# Patient Record
Sex: Male | Born: 1950 | Race: White | Hispanic: Refuse to answer | Marital: Married | Smoking: Never smoker
Health system: Southern US, Community
[De-identification: ages and names within clinical notes are randomized; demographics above are authoritative.]

## PROBLEM LIST (undated history)

## (undated) DIAGNOSIS — M109 Gout, unspecified: Secondary | ICD-10-CM

## (undated) DIAGNOSIS — Z87442 Personal history of urinary calculi: Secondary | ICD-10-CM

## (undated) HISTORY — PX: LIPOMA EXCISION: SHX5283

## (undated) HISTORY — PX: KNEE SURGERY: SHX244

## (undated) HISTORY — PX: VASECTOMY: SHX75

## (undated) HISTORY — PX: RETINAL TEAR REPAIR CRYOTHERAPY: SHX5304

## (undated) HISTORY — DX: Gout, unspecified: M10.9

---

## 2012-02-27 DIAGNOSIS — H43811 Vitreous degeneration, right eye: Secondary | ICD-10-CM | POA: Insufficient documentation

## 2012-02-27 HISTORY — DX: Vitreous degeneration, right eye: H43.811

## 2012-03-28 DIAGNOSIS — H269 Unspecified cataract: Secondary | ICD-10-CM

## 2012-03-28 HISTORY — DX: Unspecified cataract: H26.9

## 2013-04-15 DIAGNOSIS — R7301 Impaired fasting glucose: Secondary | ICD-10-CM

## 2013-04-15 DIAGNOSIS — L509 Urticaria, unspecified: Secondary | ICD-10-CM | POA: Insufficient documentation

## 2013-04-15 DIAGNOSIS — M109 Gout, unspecified: Secondary | ICD-10-CM

## 2013-04-15 DIAGNOSIS — K469 Unspecified abdominal hernia without obstruction or gangrene: Secondary | ICD-10-CM | POA: Insufficient documentation

## 2013-04-15 DIAGNOSIS — R944 Abnormal results of kidney function studies: Secondary | ICD-10-CM

## 2013-04-15 DIAGNOSIS — L259 Unspecified contact dermatitis, unspecified cause: Secondary | ICD-10-CM

## 2013-04-15 DIAGNOSIS — M2550 Pain in unspecified joint: Secondary | ICD-10-CM

## 2013-04-15 DIAGNOSIS — M6281 Muscle weakness (generalized): Secondary | ICD-10-CM

## 2013-04-15 DIAGNOSIS — D179 Benign lipomatous neoplasm, unspecified: Secondary | ICD-10-CM

## 2013-04-15 DIAGNOSIS — I10 Essential (primary) hypertension: Secondary | ICD-10-CM | POA: Insufficient documentation

## 2013-04-15 DIAGNOSIS — F411 Generalized anxiety disorder: Secondary | ICD-10-CM

## 2013-04-15 DIAGNOSIS — F32A Depression, unspecified: Secondary | ICD-10-CM

## 2013-04-15 DIAGNOSIS — F410 Panic disorder [episodic paroxysmal anxiety] without agoraphobia: Secondary | ICD-10-CM | POA: Insufficient documentation

## 2013-04-15 DIAGNOSIS — R5381 Other malaise: Secondary | ICD-10-CM

## 2013-04-15 DIAGNOSIS — M779 Enthesopathy, unspecified: Secondary | ICD-10-CM

## 2013-04-15 DIAGNOSIS — M62838 Other muscle spasm: Secondary | ICD-10-CM | POA: Insufficient documentation

## 2013-04-15 DIAGNOSIS — E8881 Metabolic syndrome: Secondary | ICD-10-CM | POA: Insufficient documentation

## 2013-04-15 DIAGNOSIS — E7800 Pure hypercholesterolemia, unspecified: Secondary | ICD-10-CM | POA: Insufficient documentation

## 2013-04-15 DIAGNOSIS — R635 Abnormal weight gain: Secondary | ICD-10-CM

## 2013-04-15 DIAGNOSIS — R21 Rash and other nonspecific skin eruption: Secondary | ICD-10-CM

## 2013-04-15 DIAGNOSIS — D485 Neoplasm of uncertain behavior of skin: Secondary | ICD-10-CM | POA: Insufficient documentation

## 2013-04-15 DIAGNOSIS — E669 Obesity, unspecified: Secondary | ICD-10-CM

## 2013-04-15 HISTORY — DX: Abnormal weight gain: R63.5

## 2013-04-15 HISTORY — DX: Impaired fasting glucose: R73.01

## 2013-04-15 HISTORY — DX: Gout, unspecified: M10.9

## 2013-04-15 HISTORY — DX: Pure hypercholesterolemia, unspecified: E78.00

## 2013-04-15 HISTORY — DX: Other muscle spasm: M62.838

## 2013-04-15 HISTORY — DX: Metabolic syndrome: E88.810

## 2013-04-15 HISTORY — DX: Rash and other nonspecific skin eruption: R21

## 2013-04-15 HISTORY — DX: Neoplasm of uncertain behavior of skin: D48.5

## 2013-04-15 HISTORY — DX: Abnormal results of kidney function studies: R94.4

## 2013-04-15 HISTORY — DX: Benign lipomatous neoplasm, unspecified: D17.9

## 2013-04-15 HISTORY — DX: Urticaria, unspecified: L50.9

## 2013-04-15 HISTORY — DX: Obesity, unspecified: E66.9

## 2013-04-15 HISTORY — DX: Unspecified contact dermatitis, unspecified cause: L25.9

## 2013-04-15 HISTORY — DX: Muscle weakness (generalized): M62.81

## 2013-04-15 HISTORY — DX: Essential (primary) hypertension: I10

## 2013-04-15 HISTORY — DX: Depression, unspecified: F32.A

## 2013-04-15 HISTORY — DX: Enthesopathy, unspecified: M77.9

## 2013-04-15 HISTORY — DX: Other malaise: R53.81

## 2013-04-15 HISTORY — DX: Panic disorder (episodic paroxysmal anxiety): F41.0

## 2013-04-15 HISTORY — DX: Pain in unspecified joint: M25.50

## 2013-04-15 HISTORY — DX: Unspecified abdominal hernia without obstruction or gangrene: K46.9

## 2013-04-15 HISTORY — DX: Generalized anxiety disorder: F41.1

## 2016-03-28 DIAGNOSIS — H524 Presbyopia: Secondary | ICD-10-CM

## 2016-03-28 HISTORY — DX: Presbyopia: H52.4

## 2017-07-30 DIAGNOSIS — D229 Melanocytic nevi, unspecified: Secondary | ICD-10-CM

## 2017-07-30 DIAGNOSIS — H5213 Myopia, bilateral: Secondary | ICD-10-CM | POA: Insufficient documentation

## 2017-07-30 DIAGNOSIS — Z8249 Family history of ischemic heart disease and other diseases of the circulatory system: Secondary | ICD-10-CM

## 2017-07-30 DIAGNOSIS — F17200 Nicotine dependence, unspecified, uncomplicated: Secondary | ICD-10-CM | POA: Insufficient documentation

## 2017-07-30 HISTORY — DX: Nicotine dependence, unspecified, uncomplicated: F17.200

## 2017-07-30 HISTORY — DX: Melanocytic nevi, unspecified: D22.9

## 2017-07-30 HISTORY — DX: Myopia, bilateral: H52.13

## 2017-07-30 HISTORY — DX: Family history of ischemic heart disease and other diseases of the circulatory system: Z82.49

## 2018-10-03 DIAGNOSIS — N200 Calculus of kidney: Secondary | ICD-10-CM

## 2018-10-03 HISTORY — DX: Calculus of kidney: N20.0

## 2018-11-12 DIAGNOSIS — M26609 Unspecified temporomandibular joint disorder, unspecified side: Secondary | ICD-10-CM

## 2018-11-12 DIAGNOSIS — H90A22 Sensorineural hearing loss, unilateral, left ear, with restricted hearing on the contralateral side: Secondary | ICD-10-CM

## 2018-11-12 HISTORY — DX: Unspecified temporomandibular joint disorder, unspecified side: M26.609

## 2018-11-12 HISTORY — DX: Sensorineural hearing loss, unilateral, left ear, with restricted hearing on the contralateral side: H90.A22

## 2019-03-26 DIAGNOSIS — R82994 Hypercalciuria: Secondary | ICD-10-CM

## 2019-03-26 HISTORY — DX: Hypercalciuria: R82.994

## 2019-11-12 DIAGNOSIS — L570 Actinic keratosis: Secondary | ICD-10-CM

## 2019-11-12 DIAGNOSIS — L738 Other specified follicular disorders: Secondary | ICD-10-CM | POA: Insufficient documentation

## 2019-11-12 DIAGNOSIS — B070 Plantar wart: Secondary | ICD-10-CM

## 2019-11-12 DIAGNOSIS — B360 Pityriasis versicolor: Secondary | ICD-10-CM

## 2019-11-12 HISTORY — DX: Other specified follicular disorders: L73.8

## 2019-11-12 HISTORY — DX: Pityriasis versicolor: B36.0

## 2019-11-12 HISTORY — DX: Actinic keratosis: L57.0

## 2019-11-12 HISTORY — DX: Plantar wart: B07.0

## 2019-11-13 DIAGNOSIS — M180 Bilateral primary osteoarthritis of first carpometacarpal joints: Secondary | ICD-10-CM

## 2019-11-13 HISTORY — DX: Bilateral primary osteoarthritis of first carpometacarpal joints: M18.0

## 2021-09-06 ENCOUNTER — Emergency Department: Payer: Medicare PPO

## 2021-09-06 ENCOUNTER — Other Ambulatory Visit: Payer: Self-pay

## 2021-09-06 ENCOUNTER — Encounter: Payer: Self-pay | Admitting: *Deleted

## 2021-09-06 DIAGNOSIS — I10 Essential (primary) hypertension: Secondary | ICD-10-CM | POA: Diagnosis not present

## 2021-09-06 DIAGNOSIS — U071 COVID-19: Secondary | ICD-10-CM | POA: Diagnosis not present

## 2021-09-06 DIAGNOSIS — R0981 Nasal congestion: Secondary | ICD-10-CM | POA: Diagnosis present

## 2021-09-06 DIAGNOSIS — N132 Hydronephrosis with renal and ureteral calculous obstruction: Secondary | ICD-10-CM | POA: Diagnosis not present

## 2021-09-06 DIAGNOSIS — Z79899 Other long term (current) drug therapy: Secondary | ICD-10-CM | POA: Insufficient documentation

## 2021-09-06 LAB — URINALYSIS, COMPLETE (UACMP) WITH MICROSCOPIC
Bacteria, UA: NONE SEEN
Bilirubin Urine: NEGATIVE
Glucose, UA: NEGATIVE mg/dL
Ketones, ur: NEGATIVE mg/dL
Leukocytes,Ua: NEGATIVE
Nitrite: NEGATIVE
Protein, ur: 30 mg/dL — AB
RBC / HPF: >50 RBC/hpf — ABNORMAL HIGH (ref 0–5)
Specific Gravity, Urine: 1.017 (ref 1.005–1.030)
Squamous Epithelial / HPF: NONE SEEN (ref 0–5)
pH: 5 (ref 5.0–8.0)

## 2021-09-06 LAB — CBC WITH DIFFERENTIAL/PLATELET
Abs Immature Granulocytes: 0.04 10*3/uL (ref 0.00–0.07)
Basophils Absolute: 0 10*3/uL (ref 0.0–0.1)
Basophils Relative: 1 %
Eosinophils Absolute: 0 10*3/uL (ref 0.0–0.5)
Eosinophils Relative: 0 %
HCT: 43.9 % (ref 39.0–52.0)
Hemoglobin: 15 g/dL (ref 13.0–17.0)
Immature Granulocytes: 1 %
Lymphocytes Relative: 11 %
Lymphs Abs: 0.8 10*3/uL (ref 0.7–4.0)
MCH: 30.2 pg (ref 26.0–34.0)
MCHC: 34.2 g/dL (ref 30.0–36.0)
MCV: 88.5 fL (ref 80.0–100.0)
Monocytes Absolute: 1.2 10*3/uL — ABNORMAL HIGH (ref 0.1–1.0)
Monocytes Relative: 15 %
Neutro Abs: 5.7 10*3/uL (ref 1.7–7.7)
Neutrophils Relative %: 72 %
Platelets: 167 10*3/uL (ref 150–400)
RBC: 4.96 MIL/uL (ref 4.22–5.81)
RDW: 12.9 % (ref 11.5–15.5)
WBC: 7.8 10*3/uL (ref 4.0–10.5)
nRBC: 0 % (ref 0.0–0.2)

## 2021-09-06 LAB — COMPREHENSIVE METABOLIC PANEL
ALT: 27 U/L (ref 0–44)
AST: 23 U/L (ref 15–41)
Albumin: 4.4 g/dL (ref 3.5–5.0)
Alkaline Phosphatase: 74 U/L (ref 38–126)
Anion gap: 6 (ref 5–15)
BUN: 22 mg/dL (ref 8–23)
CO2: 27 mmol/L (ref 22–32)
Calcium: 10 mg/dL (ref 8.9–10.3)
Chloride: 101 mmol/L (ref 98–111)
Creatinine, Ser: 1.81 mg/dL — ABNORMAL HIGH (ref 0.61–1.24)
GFR, Estimated: 40 mL/min — ABNORMAL LOW (ref >60)
Glucose, Bld: 123 mg/dL — ABNORMAL HIGH (ref 70–99)
Potassium: 3.8 mmol/L (ref 3.5–5.1)
Sodium: 134 mmol/L — ABNORMAL LOW (ref 135–145)
Total Bilirubin: 1 mg/dL (ref 0.3–1.2)
Total Protein: 7.6 g/dL (ref 6.5–8.1)

## 2021-09-06 NOTE — ED Triage Notes (Signed)
Pt has left flank pain and hematuria. Pt positive for covid today per pt.  Pt has sinus congestion and fever for 2 days.  Pt alert  speech clear.

## 2021-09-06 NOTE — ED Provider Notes (Signed)
Emergency Medicine Provider Triage Evaluation Note  Charles Waters , a 70 y.o. male  was evaluated in triage.  With history of nephrolithiasis and recent COVID-19 infection, presents to the emergency department with left-sided flank pain and hematuria.  Patient has also had some mild dysuria.  He states that his current symptoms feel like kidney stone pain he has had in the past.  He states that he has sporadic cough.  No hemoptysis, chest tightness or chest pain.  Review of Systems  Positive: Patient has left sided flank pain.  Negative: No chest pain, chest tightness or SOB.   Physical Exam  BP 139/75 (BP Location: Left Arm)   Pulse 83   Temp 99.1 F (37.3 C) (Oral)   Resp 20   Ht 5\' 8"  (1.727 m)   Wt 93 kg   SpO2 96%   BMI 31.17 kg/m  Gen:   Awake, no distress   Resp:  Normal effort  MSK:   Moves extremities without difficulty     Medical Decision Making  Medically screening exam initiated at 9:01 PM.  Appropriate orders placed.  Charles Waters was informed that the remainder of the evaluation will be completed by another provider, this initial triage assessment does not replace that evaluation, and the importance of remaining in the ED until their evaluation is complete.     [name], PA-C 09/06/21 2102    [name], MD 09/06/21 2225

## 2021-09-07 ENCOUNTER — Emergency Department
Admission: EM | Admit: 2021-09-07 | Discharge: 2021-09-07 | Disposition: A | Discharge status: Home / Self Care | Payer: Medicare PPO | Attending: Emergency Medicine | Admitting: Emergency Medicine

## 2021-09-07 DIAGNOSIS — N200 Calculus of kidney: Secondary | ICD-10-CM

## 2021-09-07 DIAGNOSIS — U071 COVID-19: Secondary | ICD-10-CM

## 2021-09-07 MED ORDER — NIRMATRELVIR/RITONAVIR (PAXLOVID) TABLET (RENAL DOSING): 2.0000 | ORAL_TABLET | Freq: Two times a day (BID) | ORAL | 0 refills | Status: AC

## 2021-09-07 MED ORDER — TAMSULOSIN HCL 0.4 MG PO CAPS: 0.4000 mg | ORAL_CAPSULE | Freq: Every day | ORAL | 0 refills | Status: AC

## 2021-09-07 MED ORDER — ONDANSETRON 4 MG PO TBDP: 4.0000 mg | ORAL_TABLET | Freq: Three times a day (TID) | ORAL | 0 refills | Status: DC | PRN

## 2021-09-07 MED ORDER — ONDANSETRON 4 MG PO TBDP
4.0000 mg | ORAL_TABLET | Freq: Once | ORAL | Status: AC
  Administered 2021-09-07: 4 mg via ORAL
  Filled 2021-09-07: qty 1

## 2021-09-07 MED ORDER — OXYCODONE HCL 5 MG PO TABS: 5.0000 mg | ORAL_TABLET | Freq: Four times a day (QID) | ORAL | 0 refills | Status: AC | PRN

## 2021-09-07 MED ORDER — OXYCODONE HCL 5 MG PO TABS
5.0000 mg | ORAL_TABLET | Freq: Once | ORAL | Status: AC
  Administered 2021-09-07: 5 mg via ORAL
  Filled 2021-09-07: qty 1

## 2021-09-07 NOTE — ED Provider Notes (Signed)
[facility] Emergency Department Provider Note  ____________________________________________   Event Date/Time   First MD Initiated Contact with Patient 09/07/21 0118     (approximate)  I have reviewed the triage vital signs    HISTORY  Chief Complaint Flank Pain    HPI Charles Waters is a 70 y.o. male who presents with flank pain.  Charles Waters is a 70 y.o. male with HTN on amlodipine, enapril, and on gout medicine on uloric, atorvastatin who comes in with left sided back pain.  Reports kidney stone before on the left.  Reports some hematuria as well.  The pain is constant, nothing makes it better or worse. No ibuprofen or tylenol. No nausea or vomiting.   2 days of covid test and took at home covid test. Reports congestion. No severe SOB. Reports has had boosters.     Medical:  HTN, gout  Allergies Patient has no known allergies.  No family history on file.  Social History Social History   Tobacco Use   Smoking status: Never  Substance Use Topics   Alcohol use: Not Currently      Review of Systems Constitutional: No fever/chills Eyes: No visual changes. ENT: No sore throat. Cardiovascular: Denies chest pain. Respiratory: no SOB. No cough Gastrointestinal: No abdominal pain.  No nausea, no vomiting.  No diarrhea.  No constipation. Genitourinary: no severe blood in urine Musculoskeletal: + flank pain Skin: Negative for rash. Neurological: Negative for headaches, focal weakness or numbness. All other ROS negative ____________________________________________   PHYSICAL EXAM:  VITAL SIGNS: ED Triage Vitals [09/06/21 2100]  Enc Vitals Group     BP 139/75     Pulse Rate 83     Resp 20     Temp 99.1 F (37.3 C)     Temp Source Oral     SpO2 96 %     Weight 205 lb (93 kg)     Height 5\' 8"  (1.727 m)     Head Circumference      Peak Flow      Pain Score 6     Pain Loc      Pain Edu?      Excl. in [place]?     Constitutional:  Alert and oriented. Discomfort noted  Eyes: Conjunctivae are normal. EOMI. Head: Atraumatic. Nose: No congestion/rhinnorhea. Mouth/Throat: Mucous membranes are moist.   Neck: No stridor. Trachea Midline. FROM Cardiovascular: Normal rate, regular rhythm.  Good peripheral circulation. Respiratory: no audible stridor, work of breathing  Gastrointestinal: Soft and nontender. No distention.  Musculoskeletal: No lower extremity tenderness nor edema.  No joint effusions. Neurologic:  Normal speech and language. No gross focal neurologic deficits are appreciated.  Skin:  Skin is warm, dry and intact. No rash noted. Psychiatric: Mood and affect are normal. Speech and behavior are normal. GU: Deferred  Flank tenderness.  ____________________________________________   LABS (all labs ordered are listed, but only abnormal results are displayed)  Labs Reviewed  CBC WITH DIFFERENTIAL/PLATELET - Abnormal; Notable for the following components:      Result Value   Monocytes Absolute 1.2 (*)    All other components within normal limits  COMPREHENSIVE METABOLIC PANEL - Abnormal; Notable for the following components:   Sodium 134 (*)    Glucose, Bld 123 (*)    Creatinine, Ser 1.81 (*)    GFR, Estimated 40 (*)    All other components within normal limits  URINALYSIS, COMPLETE (UACMP) WITH MICROSCOPIC - Abnormal; Notable for the following  components:   Color, Urine YELLOW (*)    APPearance HAZY (*)    Hgb urine dipstick LARGE (*)    Protein, ur 30 (*)    RBC / HPF >50 (*)    All other components within normal limits   ____________________________________________   ED ECG REPORT I, Vanessa [place], the attending physician, personally viewed and interpreted this ECG.   ____________________________________________  RADIOLOGY I Official radiology report(s): CT Renal Stone Study  Result Date: 09/06/2021 CLINICAL DATA:  Left-sided flank pain EXAM: CT ABDOMEN AND PELVIS WITHOUT CONTRAST  TECHNIQUE: Multidetector CT imaging of the abdomen and pelvis was performed following the standard protocol without IV contrast. COMPARISON:  None. FINDINGS: Lower chest: Lung bases demonstrate no acute consolidation or effusion. Normal cardiac size. Calcified nodule or lymph node at the left cardio phrenic sulcus. Hepatobiliary: Numerous hepatic cysts. Additional subcentimeter low-density liver lesions too small to further characterize. Gallstones. No biliary dilatation Pancreas: Unremarkable. No pancreatic ductal dilatation or surrounding inflammatory changes. Spleen: Normal in size without focal abnormality. Adrenals/Urinary Tract: Adrenal glands are normal. Small intrarenal stones bilaterally. Mild left hydronephrosis and hydroureter, secondary to a 5 by 5 mm stone in the distal left ureter at the left UVJ. Stomach/Bowel: Stomach is within normal limits. Appendix appears normal. No evidence of bowel wall thickening, distention, or inflammatory changes. Diverticular disease of the colon without acute inflammatory process. Vascular/Lymphatic: Mild aortic atherosclerosis. No aneurysm. No suspicious nodes. Reproductive: Prostate is unremarkable. Other: Negative for pelvic effusion or free air Musculoskeletal: No acute or significant osseous findings. IMPRESSION: 1. Mild left hydronephrosis and hydroureter, secondary to a 5 mm stone at the left UVJ. 2. Small intrarenal stones 3. 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 Gallstones Electronically Signed   By: Donavan Foil M.D.   On: 09/06/2021 21:38    ____________________________________________   PROCEDURES  Procedure(s) performed (including Critical Care):  Procedures   ____________________________________________   INITIAL IMPRESSION / ASSESSMENT AND PLAN / ED COURSE  Charles Waters was evaluated in Emergency Department on 09/07/2021 for the symptoms described in the history of present illness. He was evaluated in the  context of the global COVID-19 pandemic, which necessitated consideration that the patient might be at risk for infection with the SARS-CoV-2 virus that causes COVID-19. Institutional protocols and algorithms that pertain to the evaluation of patients at risk for COVID-19 are in a state of rapid change based on information released by regulatory bodies including the CDC and federal and state organizations. These policies and algorithms were followed during the patient's care in the ED.     Pt presents with flank pain.  Suspect this is most likely kidney stone. Will get labs to evaluate for electrolyte abnormalities/AKI.  CT scan ordered to evaluate for kidney stone.  Also consider appendicitis vs SBO although less likely given description of pain.  Will get UA to evaluate for UTI/pyelo.  Cr 1.8 /  Discussed paxlovid and pt wants to do. D/w pharm on recs for dosing given kidney function.  Doing renal dose of paxlovid.    CT confirms kidney stone.  No evidence of infected stone. No fevers. UA re-assuring.   Reviewed labs--  D/w pt rx with ylenol and oxycodone for breath though pain.  Understands not to drive or work while on oxycodone.  Given zofran/flomax and urology number for follow up.  Pt expressed understanding and felt comfortable with dc home. Understands to come back for fevers, understands it could be from covid  but will need repeat UA to rule out infected stone. Pt to f/u Dr. Bernardo Heater.   No rooms available  offered pt to wait but pt requesting to leave from triage.           ____________________________________________   FINAL CLINICAL IMPRESSION(S) / ED DIAGNOSES   Final diagnoses:  [id number]  Kidney stone      MEDICATIONS GIVEN DURING THIS VISIT:  Medications  oxyCODONE (Oxy IR/ROXICODONE) immediate release tablet 5 mg (has no administration in time range)  ondansetron (ZOFRAN-ODT) disintegrating tablet 4 mg (has no administration in time range)     ED Discharge  Orders          Ordered    oxyCODONE (ROXICODONE) 5 MG immediate release tablet  Every 6 hours PRN        09/07/21 0128    ondansetron (ZOFRAN-ODT) 4 MG disintegrating tablet  Every 8 hours PRN        09/07/21 0128    tamsulosin (FLOMAX) 0.4 MG CAPS capsule  Daily        09/07/21 0128    nirmatrelvir/ritonavir EUA, renal dosing, (PAXLOVID) 10 x 150 MG & 10 x 100MG  TABS  2 times daily        09/07/21 0132             Note:  This document was prepared using Dragon voice recognition software and may include unintentional dictation errors.   Vanessa [place], MD 09/07/21 [phone]

## 2021-09-07 NOTE — Discharge Instructions (Addendum)
You have a kidney stone. See report below.   Take tylenol 1g every 8 hours daily. Take oxycodone for breakthrough pain. Do not drive, work, or operate machinery while on this.  Take zofran to help with nausea. Take Flomax to help dilate uretha. Call urology number above to schedule outpatient appointment. Return to ED for fevers, unable to keep food down, or any other concerns.   Take paxlovid for COVID 19. Hold statin for 28 hours after completing the paxlovid. IF blood pressures low take 1/2 amlodpine.

## 2021-09-08 ENCOUNTER — Ambulatory Visit: Payer: Medicare PPO | Admitting: Urology

## 2021-09-13 NOTE — Progress Notes (Signed)
09/14/21 4:19 PM   Charles Waters October 07, 1950 [id number]  Referring provider:  No referring provider defined for this encounter. Chief Complaint  Patient presents with   Nephrolithiasis     HPI: Charles Waters is a 70 y.o.male who presents today for further evaluation of kidney stone.   He has a personal history of gout on uloric and kidney stones. He passed the stone spontaneously a few years ago.  He was seen in the ED on 09/07/2021 with left sided back pain. CT abdomen and pelvis revealed adrenal glands are normal. Small intrarenal stones bilaterally. Mild left hydronephrosis and hydroureter, secondary to a 5 by 5 mm stone in the distal left ureter at the left UVJ. 400 Hounsfield units, skin to stone distance 8 mm.   KUB was personally reviewed today and showed stone is in the same position as seen in CT on 09/07/2021.  He reports today that he believes he had interval passage of the kidney stone. He reports that he is no longer experiencing pain that he was experiencing previously. He still experiences discomfort at the end of his urination.   He reports that when he played football when he was younger he took salt pills as well as took [place], he believes this is correlated to why he has kidney stones.    PMH: Past Medical History:  Diagnosis Date   Abdominal hernia 04/15/2013   Abnormal weight gain 04/15/2013   Actinic keratoses 11/12/2019   Formatting of this note might be different from the original. Seen at [facility] dermatology by PA Janese Banks for full skin exam on 11/20/2018.  Distributed on the face.  These were precancerous proliferations that can occur with sun damaged skin.  Also subset of AK's can develop into squamous cell carcinoma.  She discussed treatment with cryotherapy versus 5-fluorouracil.  Recommended field tr   Anxiety state 04/15/2013   Benign essential hypertension 04/15/2013   Last Assessment & Plan:  Formatting of this note might be different from  the original. ASSESSMENT: Blood pressure is well controlled here today.  Home BP readings range: no concerning readings   PLAN:  Medications: continue Amlodipine, Enalapril. -  Labs as per orders if indicated/due Check & log BP and bring readings to f/u and machine.  Low sodium/DASH diet is advised. Consistent aerobic exercis   Cataract 03/28/2012   Contact dermatitis and other eczema, due to unspecified cause 04/15/2013   Depression 27/78/2423   Dysmetabolic syndrome X 53/61/4431   Family history of congestive heart failure 07/30/2017   Formatting of this note might be different from the original. 03/28/2016:  Mother & identical twin brother both with CHF. Identical twin much self inflicted-alcoholism, smoking, drugs, etc.  We discuss utility of BNP testing and/or cardiac consult. I advise the latter for full evaluation as Charles Waters has RFs certainly with HTN & HL, white male of 64yoa. He is obese by BMI despite his routine CV exercise    Gout    Gout, unspecified 04/15/2013   Last Assessment & Plan:  Formatting of this note might be different from the original. ASSESSMENT: stable without acute flares   PLAN: Medications: continue Uloric 40mg  generic  Dietary precautions  Continue routine exercise.  Weight loss encouraged.   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 AH Type I   Hypercholesterolemia 04/15/2013   Last Assessment & Plan:  Formatting of this note is different from the original. ASSESSMENT: Level of control to be determined by  today's lab results  Lab Results  Component Value Date   CHOLTOTAL 109 06/08/2020   CHOLTOTAL 127 11/04/2019   CHOLTOTAL 97 07/11/2019   Lab Results  Component Value Date   HDL 48 06/08/2020   HDL 60 11/04/2019   HDL 42 07/11/2019   Lab Results  Component Value Date   L   Impaired fasting glucose 04/15/2013   Lipoma 04/15/2013   Melanocytic nevus 07/30/2017   Muscle weakness (generalized) 04/15/2013   Myopia of both eyes  07/30/2017   Neoplasm of uncertain behavior of skin 04/15/2013   Nephrolithiasis 10/03/2018   Nicotine dependence 07/30/2017   Obesity 04/15/2013   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   Last Assessment & Plan:  Formatting of this note is different from the original. ASSESSMENT: improved & he's made adjustments   PLAN: Patient referred to and seen by Dr. Astrid Divine of [facility] on 11/13/2019.  They discussed the history symptoms and radiograph findings.  They address 3 levels of treatment.  Level 1-splinting ora   Pain in joints 04/15/2013   Panic disorder without agoraphobia 04/15/2013   Presbyopia 03/28/2016   PVD (posterior vitreous detachment), right eye 02/27/2012   Formatting of this note might be different from the original. Hemorrhagic, no RD or tears  Incomplete posterior vitreous separation, OS  Last Assessment & Plan:  Formatting of this note might be different from the original. Relevant Hx: Course: Daily Update: Today's Plan:   Rash and other nonspecific skin eruption 04/15/2013   Renal function test abnormal 04/15/2013   Sebaceous hyperplasia 11/12/2019   Formatting of this note might be different from the original. Seen at [facility] dermatology by PA Janese Banks for full skin exam on 11/20/2018.  Located on the face.  These are benign and enlarged oil glands within the skin.   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 Seen at [facility] dermatology by PA Janese Banks for full skin exam on 11/20/2018.  Located on the trunk.  Next counseled that this is a yeast infection.  Reported the rash is not bothersome and opted for no  treatment.   Urticaria 04/15/2013   Verruca plantaris 11/12/2019   Formatting of this note might be different from the original. Seen at [facility] dermatology by PA Janese Banks for full skin exam on 11/20/2018.  Located on the right lateral plantar midfoot.  Generally care on the palms and soles and are caused by viral infections.  The spread through direct contact.  Gust that there are treatment options and patient declined treatment that day.    Surgical History: Past Surgical History:  Procedure Laterality Date   KNEE SURGERY     LIPOMA EXCISION Right    RETINAL TEAR REPAIR CRYOTHERAPY     VASECTOMY      Home Medications:  Allergies as of 09/14/2021       Reactions   Banana Anaphylaxis   Other reaction(s): Other (See Comments) Tongue swelling   Latex    Other reaction(s): Other (See Comments) Burning sensation        Medication List        Accurate as of September 14, 2021  4:19 PM. If you have  any questions, ask your nurse or doctor.          amLODipine 10 MG tablet Commonly known as: NORVASC Take 10 mg by mouth daily.   aspirin 81 MG EC tablet Take by mouth.   atorvastatin 40 MG tablet Commonly known as: LIPITOR Take 40 mg by mouth daily.   buPROPion 150 MG 24 hr tablet Commonly known as: WELLBUTRIN XL bupropion HCl XL 150 mg 24 hr tablet, extended release   chlorthalidone 25 MG tablet Commonly known as: HYGROTON chlorthalidone 25 mg tablet  TAKE 1/2 TABLET BY MOUTH ONCE DAILY   diazepam 2 MG tablet Commonly known as: VALIUM diazepam 2 mg tablet   diclofenac Sodium 1 % Gel Commonly known as: VOLTAREN Apply topically.   enalapril 20 MG tablet Commonly known as: VASOTEC Take 20 mg by mouth daily.   febuxostat 40 MG tablet Commonly known as: ULORIC febuxostat 40 mg tablet  TAKE 1 TABLET (40 MG TOTAL) BY MOUTH ONCE DAILY FOR GOUT FLARE PREVENTION   fluorouracil 5 % cream Commonly known as: EFUDEX fluorouracil 5 % topical cream  APPLY A  THIN LAYER TWICE DAILY TO DESIGNATED AREAS FOR 2 WEEKS AS TOLERATED. ENSURE SUN PROTECTION.   gabapentin 300 MG capsule Commonly known as: NEURONTIN gabapentin 300 mg capsule   nicotine polacrilex 2 MG gum Commonly known as: NICORETTE Place inside cheek.   ondansetron 4 MG disintegrating tablet Commonly known as: ZOFRAN-ODT Take 1 tablet (4 mg total) by mouth every 8 (eight) hours as needed for nausea or vomiting.   Potassium Citrate 15 MEQ (1620 MG) Tbcr potassium citrate ER 15 mEq (1,620 mg) tablet,extended release  TAKE 1 TABLET (15 MEQ TOTAL) BY MOUTH 2 (TWO) TIMES DAILY WITH MEALS   rosuvastatin 5 MG tablet Commonly known as: CRESTOR Crestor 5 mg tablet   sildenafil 100 MG tablet Commonly known as: VIAGRA sildenafil 100 mg tablet  TAKE 1 TABLET (100 MG TOTAL) BY MOUTH ONCE DAILY AS NEEDED FOR ERECTILE DYSFUNCTION   tamsulosin 0.4 MG Caps capsule Commonly known as: FLOMAX Take 1 capsule (0.4 mg total) by mouth daily for 14 days.   triamcinolone cream 0.5 % Commonly known as: KENALOG Apply topically.        Allergies:  Allergies  Allergen Reactions   Banana Anaphylaxis    Other reaction(s): Other (See Comments) Tongue swelling    Latex     Other reaction(s): Other (See Comments) Burning sensation     Family History: Family History  Problem Relation Age of Onset   Heart failure Mother    Throat cancer Father    Heart disease Brother     Social History:  reports that he has never smoked. He does not have any smokeless tobacco history on file. He reports that he does not currently use alcohol. No history on file for drug use.   Physical Exam: BP 129/80    Pulse 85    Ht 5\' 9"  (1.753 m)    Wt 204 lb (92.5 kg)    BMI 30.13 kg/m   Constitutional:  Alert and oriented, No acute distress. HEENT: [place] AT, moist mucus membranes.  Trachea midline, no masses. Cardiovascular: No clubbing, cyanosis, or edema. Respiratory: Normal respiratory effort, no increased  work of breathing. Skin: No rashes, bruises or suspicious lesions. Neurologic: Grossly intact, no focal deficits, moving all 4 extremities. Psychiatric: Normal mood and affect.  Laboratory Data:  Lab Results  Component Value Date   CREATININE 1.81 (H) 09/06/2021    Urinalysis  Urinalysis today is negative  Pertinent Imaging: CLINICAL DATA:  Left-sided flank pain   EXAM: CT ABDOMEN AND PELVIS WITHOUT CONTRAST   TECHNIQUE: Multidetector CT imaging of the abdomen and pelvis was performed following the standard protocol without IV contrast.   COMPARISON:  None.   FINDINGS: Lower chest: Lung bases demonstrate no acute consolidation or effusion. Normal cardiac size. Calcified nodule or lymph node at the left cardio phrenic sulcus.   Hepatobiliary: Numerous hepatic cysts. Additional subcentimeter low-density liver lesions too small to further characterize. Gallstones. No biliary dilatation   Pancreas: Unremarkable. No pancreatic ductal dilatation or surrounding inflammatory changes.   Spleen: Normal in size without focal abnormality.   Adrenals/Urinary Tract: Adrenal glands are normal. Small intrarenal stones bilaterally. Mild left hydronephrosis and hydroureter, secondary to a 5 by 5 mm stone in the distal left ureter at the left UVJ.   Stomach/Bowel: Stomach is within normal limits. Appendix appears normal. No evidence of bowel wall thickening, distention, or inflammatory changes. Diverticular disease of the colon without acute inflammatory process.   Vascular/Lymphatic: Mild aortic atherosclerosis. No aneurysm. No suspicious nodes.   Reproductive: Prostate is unremarkable.   Other: Negative for pelvic effusion or free air   Musculoskeletal: No acute or significant osseous findings.   IMPRESSION: 1. Mild left hydronephrosis and hydroureter, secondary to a 5 mm stone at the left UVJ. 2. Small intrarenal stones 3. 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 Gallstones     Electronically Signed   By: Donavan Foil M.D.   On: 09/06/2021 21:38   KUB personally reviewed today and compared to the above CT.  Radiologic trepidation is pending on the KUB however there is a calcification at the same level of the stone in the CT scan indicating retained stone.  Assessment & Plan:    Left UVJ stone  - We discussed various treatment options for urolithiasis including observation with or without medical expulsive therapy, shockwave lithotripsy (SWL), ureteroscopy and laser lithotripsy with stent placement, and percutaneous nephrolithotomy.   We discussed that management is based on stone size, location, density, patient co-morbidities, and patient preference.    Stones <32mm in size have a >80% spontaneous passage rate. Data surrounding the use of tamsulosin for medical expulsive therapy is controversial, but meta analyses suggests it is most efficacious for distal stones between 5-64mm in size. Possible side effects include dizziness/lightheadedness, and retrograde ejaculation.   SWL has a lower stone free rate in a single procedure, but also a lower complication rate compared to ureteroscopy and avoids a stent and associated stent related symptoms. Possible complications include renal hematoma, steinstrasse, and need for additional treatment. We discussed the role of his increased skin to stone distance can lead to decreased efficacy with shockwave lithotripsy.   Ureteroscopy with laser lithotripsy and stent placement has a higher stone free rate than SWL in a single procedure, however increased complication rate including possible infection, ureteral injury, bleeding, and stent related morbidity. Common stent related symptoms include dysuria, urgency/frequency, and flank pain.   After an extensive discussion of the risks and benefits of the above treatment options, the patient would like to proceed with ESWL  -He would like to  continue to strain his urine this week and be scheduled for ESWL the following.  In the interim, he understands warning symptoms and signs and symptoms and again the need for more urgent/emergent evaluation.  -We discussed general stone prevention techniques including drinking plenty water with goal of producing  2.5 L urine daily, increased citric acid intake, avoidance of high oxalate containing foods, and decreased salt intake.  Information about dietary recommendations given today.    Return for ESWL.   I,Kailey Littlejohn,acting as a Education administrator for Hollice Espy, MD.,have documented all relevant documentation on the behalf of Hollice Espy, MD,as directed by  Hollice Espy, MD while in the presence of Hollice Espy, MD.  I have reviewed the above documentation for accuracy and completeness, and I agree with the above.   Hollice Espy, MD   [facility] [address] [place], [place] [address] [phone]

## 2021-09-14 ENCOUNTER — Encounter: Payer: Self-pay | Admitting: Urology

## 2021-09-14 ENCOUNTER — Other Ambulatory Visit: Payer: Self-pay

## 2021-09-14 ENCOUNTER — Ambulatory Visit
Admission: RE | Admit: 2021-09-14 | Discharge: 2021-09-14 | Disposition: A | Discharge status: Home / Self Care | Payer: Medicare PPO | Attending: Urology | Admitting: Urology

## 2021-09-14 ENCOUNTER — Ambulatory Visit: Payer: Medicare PPO | Admitting: Urology

## 2021-09-14 ENCOUNTER — Ambulatory Visit
Admission: RE | Admit: 2021-09-14 | Discharge: 2021-09-14 | Disposition: A | Discharge status: Home / Self Care | Payer: Medicare PPO | Source: Ambulatory Visit | Attending: Urology | Admitting: Urology

## 2021-09-14 VITALS — BP 129/80 | HR 85 | Ht 69.0 in | Wt 204.0 lb

## 2021-09-14 DIAGNOSIS — N200 Calculus of kidney: Secondary | ICD-10-CM

## 2021-09-14 LAB — MICROSCOPIC EXAMINATION: Bacteria, UA: NONE SEEN

## 2021-09-14 LAB — URINALYSIS, COMPLETE
Bilirubin, UA: NEGATIVE
Glucose, UA: NEGATIVE
Nitrite, UA: NEGATIVE
Protein,UA: NEGATIVE
RBC, UA: NEGATIVE
Specific Gravity, UA: 1.015 (ref 1.005–1.030)
Urobilinogen, Ur: 0.2 mg/dL (ref 0.2–1.0)
pH, UA: 6.5 (ref 5.0–7.5)

## 2021-09-14 NOTE — Patient Instructions (Signed)
Lithotripsy Lithotripsy is a treatment that can help break up kidney stones that are too large to pass on their own. This is a nonsurgical procedure that crushes a kidney stone with shock waves. These shock waves pass through your body and focus on the kidney stone. They cause the kidney stone to break up into smaller pieces while it is still in the urinary tract. The smaller pieces of stone can pass more easily out of your body in the urine. Tell a health care provider about: Any allergies you have. All medicines you are taking, including vitamins, herbs, eye drops, creams, and over-the-counter medicines. Any problems you or family members have had with anesthetic medicines. Any blood disorders you have. Any surgeries you have had. Any medical conditions you have. Whether you are pregnant or may be pregnant. What are the risks? Generally, this is a safe procedure. However, problems may occur, including: Infection. Bleeding from the kidney. Bruising of the kidney or skin. Scarring of the kidney, which can lead to: Increased blood pressure. Poor kidney function. Return (recurrence) of kidney stones. Damage to other structures or organs, such as the liver, colon, spleen, or pancreas. Blockage (obstruction) of the tube that carries urine from the kidney to the bladder (ureter). Failure of the kidney stone to break into pieces (fragments). What happens before the procedure? Staying hydrated Follow instructions from your health care provider about hydration, which may include: Up to 2 hours before the procedure - you may continue to drink clear liquids, such as water, clear fruit juice, black coffee, and plain tea. Eating and drinking restrictions Follow instructions from your health care provider about eating and drinking, which may include: 8 hours before the procedure - stop eating heavy meals or foods, such as meat, fried foods, or fatty foods. 6 hours before the procedure - stop eating  light meals or foods, such as toast or cereal. 6 hours before the procedure - stop drinking milk or drinks that contain milk. 2 hours before the procedure - stop drinking clear liquids. Medicines Ask your health care provider about: Changing or stopping your regular medicines. This is especially important if you are taking diabetes medicines or blood thinners. Taking medicines such as aspirin and ibuprofen. These medicines can thin your blood. Do not take these medicines unless your health care provider tells you to take them. Taking over-the-counter medicines, vitamins, herbs, and supplements. Tests You may have tests, such as: Blood tests. Urine tests. Imaging tests, such as a CT scan. General instructions Plan to have someone take you home from the hospital or clinic. If you will be going home right after the procedure, plan to have someone with you for 24 hours. Ask your health care provider what steps will be taken to help prevent infection. These may include washing skin with a germ-killing soap. What happens during the procedure?  An IV will be inserted into one of your veins. You will be given one or more of the following: A medicine to help you relax (sedative). A medicine to make you fall asleep (general anesthetic). A water-filled cushion may be placed behind your kidney or on your abdomen. In some cases, you may be placed in a tub of lukewarm water. Your body will be positioned in a way that makes it easy to target the kidney stone. An X-ray or ultrasound exam will be done to locate your stone. Shock waves will be aimed at the stone. If you are awake, you may feel a tapping sensation as  the shock waves pass through your body. A flexible tube with holes in it (stent) may be placed in the ureter. This will help keep urine flowing from the kidney if the fragments of the stone have been blocking the ureter. The procedure may vary among health care providers and hospitals. What  happens after the procedure? You may have an X-ray to see whether the procedure was able to break up the kidney stone and how much of the stone has passed. If large stone fragments remain after treatment, you may need to have a second procedure at a later time. Your blood pressure, heart rate, breathing rate, and blood oxygen level will be monitored until you leave the hospital or clinic. You may be given antibiotics or pain medicine as needed. If a stent was placed in your ureter during surgery, it may stay in place for a few weeks. You may need to strain your urine to collect pieces of the kidney stone for testing. You will need to drink plenty of water. If you were given a sedative during the procedure, it can affect you for several hours. Do not drive or operate machinery until your health care provider says that it is safe. Summary Lithotripsy is a treatment that can help break up kidney stones that are too large to pass on their own. Lithotripsy is a nonsurgical procedure that crushes a kidney stone with shock waves. Generally, this is a safe procedure. However, problems may occur, including damage to the kidney or other organs, infection, or obstruction of the tube that carries urine from the kidney to the bladder (ureter). You may have a stent placed in your ureter to help drain your urine. This stent may stay in place for a few weeks. After the procedure, you will need to drink plenty of water. You may be asked to strain your urine to collect pieces of the kidney stone for testing. This information is not intended to replace advice given to you by your health care provider. Make sure you discuss any questions you have with your health care provider. Document Revised: 06/25/2019 Document Reviewed: 06/26/2019 Elsevier Patient Education  Charles Waters.

## 2021-09-15 ENCOUNTER — Other Ambulatory Visit: Payer: Self-pay | Admitting: Urology

## 2021-09-15 DIAGNOSIS — N201 Calculus of ureter: Secondary | ICD-10-CM

## 2021-09-15 NOTE — Progress Notes (Signed)
ESWL ORDER FORM  Expected date of procedure: 09/23/21  Surgeon:  whom ever is on the truck  Post op standing: 2-4wk follow up w/KUB prior  Anticoagulation/Aspirin/NSAID standing order: may continue  Anesthesia standing order: MAC  VTE standing: SCD's  Dx: Left Ureteral Stone  Procedure: left Extracorporeal shock wave lithotripsy  CPT : 03888  Standing Order Set:   *NPO after mn, KUB  *NS 159m/hr, Keflex 5099mPO, Benadryl 2578mO, Valium 56m31m, Zofran 4mg 22m   Medications if other than standing orders:   NONE

## 2021-09-17 LAB — CULTURE, URINE COMPREHENSIVE

## 2021-09-23 ENCOUNTER — Encounter
Admission: RE | Disposition: A | Discharge status: Home / Self Care | Payer: Self-pay | Source: Home / Self Care | Attending: Urology

## 2021-09-23 ENCOUNTER — Ambulatory Visit: Payer: Medicare PPO

## 2021-09-23 ENCOUNTER — Ambulatory Visit
Admission: RE | Admit: 2021-09-23 | Discharge: 2021-09-23 | Disposition: A | Discharge status: Home / Self Care | Payer: Medicare PPO | Attending: Urology | Admitting: Urology

## 2021-09-23 DIAGNOSIS — N201 Calculus of ureter: Secondary | ICD-10-CM

## 2021-09-23 DIAGNOSIS — Z87442 Personal history of urinary calculi: Secondary | ICD-10-CM | POA: Diagnosis not present

## 2021-09-23 HISTORY — PX: EXTRACORPOREAL SHOCK WAVE LITHOTRIPSY: SHX1557

## 2021-09-23 SURGERY — LITHOTRIPSY, ESWL
Anesthesia: Moderate Sedation | Laterality: Left

## 2021-09-23 MED ORDER — DIAZEPAM 5 MG PO TABS
10.0000 mg | ORAL_TABLET | ORAL | Status: AC
  Administered 2021-09-23: 07:00:00 10 mg via ORAL

## 2021-09-23 MED ORDER — DIPHENHYDRAMINE HCL 25 MG PO CAPS
ORAL_CAPSULE | ORAL | Status: AC
  Filled 2021-09-23: qty 1

## 2021-09-23 MED ORDER — SODIUM CHLORIDE 0.9 % IV SOLN: INTRAVENOUS | Status: DC

## 2021-09-23 MED ORDER — CEPHALEXIN 500 MG PO CAPS
ORAL_CAPSULE | ORAL | Status: AC
  Filled 2021-09-23: qty 1

## 2021-09-23 MED ORDER — CEPHALEXIN 500 MG PO CAPS
500.0000 mg | ORAL_CAPSULE | Freq: Once | ORAL | Status: AC
  Administered 2021-09-23: 07:00:00 500 mg via ORAL

## 2021-09-23 MED ORDER — ONDANSETRON HCL 4 MG/2ML IJ SOLN: 4.0000 mg | Freq: Once | INTRAMUSCULAR | Status: AC

## 2021-09-23 MED ORDER — DIAZEPAM 5 MG PO TABS
ORAL_TABLET | ORAL | Status: AC
  Filled 2021-09-23: qty 2

## 2021-09-23 MED ORDER — DIPHENHYDRAMINE HCL 25 MG PO CAPS
25.0000 mg | ORAL_CAPSULE | ORAL | Status: AC
  Administered 2021-09-23: 07:00:00 25 mg via ORAL

## 2021-09-23 MED ORDER — ONDANSETRON HCL 4 MG/2ML IJ SOLN
INTRAMUSCULAR | Status: AC
  Administered 2021-09-23: 07:00:00 4 mg via INTRAVENOUS
  Filled 2021-09-23: qty 2

## 2021-09-23 NOTE — OR Nursing (Signed)
Patient prepared for lithotripsy.  Dr Bernardo Heater at bedside to discuss stone is not visible at this time.  Procedure canceled.

## 2021-09-23 NOTE — Progress Notes (Signed)
Charles Waters was scheduled for SWL of a 6 mm left distal ureteral calculus this morning.  The calculus was clearly visualized on KUB 09/14/2021 however is not visualized today.  He has not been hurting but is not aware of passing a stone.  We will keep his scheduled postop follow-up to discuss stone prevention measures

## 2021-09-24 ENCOUNTER — Encounter: Payer: Self-pay | Admitting: Urology

## 2021-09-30 ENCOUNTER — Other Ambulatory Visit: Payer: Self-pay

## 2021-09-30 DIAGNOSIS — N201 Calculus of ureter: Secondary | ICD-10-CM

## 2021-09-30 NOTE — Progress Notes (Signed)
Opened in Error.

## 2021-10-04 ENCOUNTER — Telehealth: Payer: Self-pay

## 2021-10-04 NOTE — Telephone Encounter (Signed)
PATIENT WILL CALL us BACK TO SCHEDULE AFTER HE TALKS TO HIS DAUGHTER

## 2021-10-05 ENCOUNTER — Other Ambulatory Visit: Payer: Self-pay

## 2021-10-05 DIAGNOSIS — Z1211 Encounter for screening for malignant neoplasm of colon: Secondary | ICD-10-CM

## 2021-10-05 MED ORDER — CLENPIQ 10-3.5-12 MG-GM -GM/160ML PO SOLN: 1.0000 | ORAL | 0 refills | Status: DC

## 2021-10-05 NOTE — Progress Notes (Signed)
Gastroenterology Pre-Procedure Review  Request Date: 10/28/2021 Requesting Physician: Dr. Vicente Males  PATIENT REVIEW QUESTIONS: The patient responded to the following health history questions as indicated:    1. Are you having any GI issues? no 2. Do you have a personal history of Polyps? no 3. Do you have a family history of Colon Cancer or Polyps? no 4. Diabetes Mellitus? no 5. Joint replacements in the past 12 months?no 6. Major health problems in the past 3 months?yes (had kidney stone all better now) 7. Any artificial heart valves, MVP, or defibrillator?no    MEDICATIONS & ALLERGIES:    Patient reports the following regarding taking any anticoagulation/antiplatelet therapy:   Plavix, Coumadin, Eliquis, Xarelto, Lovenox, Pradaxa, Brilinta, or Effient? no Aspirin? yes (81 mg )  Patient confirms/reports the following medications:  Current Outpatient Medications  Medication Sig Dispense Refill   amLODipine (NORVASC) 10 MG tablet Take 10 mg by mouth daily.     aspirin 81 MG EC tablet Take by mouth.     atorvastatin (LIPITOR) 40 MG tablet Take 40 mg by mouth daily.     buPROPion (WELLBUTRIN XL) 150 MG 24 hr tablet bupropion HCl XL 150 mg 24 hr tablet, extended release     chlorthalidone (HYGROTON) 25 MG tablet chlorthalidone 25 mg tablet  TAKE 1/2 TABLET BY MOUTH ONCE DAILY     diazepam (VALIUM) 2 MG tablet diazepam 2 mg tablet     diclofenac Sodium (VOLTAREN) 1 % GEL Apply topically.     enalapril (VASOTEC) 20 MG tablet Take 20 mg by mouth daily.     febuxostat (ULORIC) 40 MG tablet febuxostat 40 mg tablet  TAKE 1 TABLET (40 MG TOTAL) BY MOUTH ONCE DAILY FOR GOUT FLARE PREVENTION     fluorouracil (EFUDEX) 5 % cream fluorouracil 5 % topical cream  APPLY A THIN LAYER TWICE DAILY TO DESIGNATED AREAS FOR 2 WEEKS AS TOLERATED. ENSURE SUN PROTECTION.     gabapentin (NEURONTIN) 300 MG capsule gabapentin 300 mg capsule     nicotine polacrilex (NICORETTE) 2 MG gum Place inside cheek.      ondansetron (ZOFRAN-ODT) 4 MG disintegrating tablet Take 1 tablet (4 mg total) by mouth every 8 (eight) hours as needed for nausea or vomiting. 20 tablet 0   Potassium Citrate 15 MEQ (1620 MG) TBCR potassium citrate ER 15 mEq (1,620 mg) tablet,extended release  TAKE 1 TABLET (15 MEQ TOTAL) BY MOUTH 2 (TWO) TIMES DAILY WITH MEALS     rosuvastatin (CRESTOR) 5 MG tablet Crestor 5 mg tablet     sildenafil (VIAGRA) 100 MG tablet sildenafil 100 mg tablet  TAKE 1 TABLET (100 MG TOTAL) BY MOUTH ONCE DAILY AS NEEDED FOR ERECTILE DYSFUNCTION     triamcinolone cream (KENALOG) 0.5 % Apply topically.     No current facility-administered medications for this visit.    Patient confirms/reports the following allergies:  Allergies  Allergen Reactions   Banana Anaphylaxis    Other reaction(s): Other (See Comments) Tongue swelling    Latex     Other reaction(s): Other (See Comments) Burning sensation     No orders of the defined types were placed in this encounter.   AUTHORIZATION INFORMATION Primary Insurance: 1D#: Group #:  Secondary Insurance: 1D#: Group #:  SCHEDULE INFORMATION: Date: 10/28/2021 Time: Location: [facility]

## 2021-10-13 NOTE — Progress Notes (Signed)
10/14/2021 8:14 PM   Charles Waters [birth year] [id number]  Referring provider: Ladell Pier, MD [address] [place],  [place] [address]  Chief Complaint  Patient presents with   Nephrolithiasis   Hematuria   Urology History: 1. Nephrolithiasis -passed a stone spontaneously a few years ago and recently -CT renal stone 08/2021 - small bilateral intrarenal stones  2. High risk hematuria -non-smoker -secondary to passage of stone   3.  Vasectomy  HPI: Charles Waters is a 71 y.o. male who presents today for follow up.   He was scheduled for ESWL for a 6 mm left distal ureteral calculus on September 23, 2021, but when it came time for the procedure the calculus was not clearly visualized on the KUB.  He was not having any episodes of renal colic, so treatment was postponed.  UA negative for micro heme.  At this time, he has no urinary complaints.  Patient denies any modifying or aggravating factors.  Patient denies any gross hematuria, dysuria or suprapubic/flank pain.  Patient denies any fevers, chills, nausea or vomiting.    UA negative for micro heme.    He had a 35-HGDJ metabolic work-up at [facility], but he cannot recall the exact results.  He was placed on a diuretic because issues with his potassium.  PMH: Past Medical History:  Diagnosis Date   Abdominal hernia 04/15/2013   Abnormal weight gain 04/15/2013   Actinic keratoses 11/12/2019   Formatting of this note might be different from the original. Seen at [facility] dermatology by PA Janese Banks for full skin exam on 11/20/2018.  Distributed on the face.  These were precancerous proliferations that can occur with sun damaged skin.  Also subset of AK's can develop into squamous cell carcinoma.  She discussed treatment with cryotherapy versus 5-fluorouracil.  Recommended field tr   Anxiety state 04/15/2013   Benign essential hypertension 04/15/2013   Last Assessment & Plan:  Formatting of this note might be different  from the original. ASSESSMENT: Blood pressure is well controlled here today.  Home BP readings range: no concerning readings   PLAN:  Medications: continue Amlodipine, Enalapril. -  Labs as per orders if indicated/due Check & log BP and bring readings to f/u and machine.  Low sodium/DASH diet is advised. Consistent aerobic exercis   Cataract 03/28/2012   Contact dermatitis and other eczema, due to unspecified cause 04/15/2013   Depression 24/26/8341   Dysmetabolic syndrome X 96/22/2979   Family history of congestive heart failure 07/30/2017   Formatting of this note might be different from the original. 03/28/2016:  Mother & identical twin brother both with CHF. Identical twin much self inflicted-alcoholism, smoking, drugs, etc.  We discuss utility of BNP testing and/or cardiac consult. I advise the latter for full evaluation as Salah has RFs certainly with HTN & HL, white male of 64yoa. He is obese by BMI despite his routine CV exercise    Gout    Gout, unspecified 04/15/2013   Last Assessment & Plan:  Formatting of this note might be different from the original. ASSESSMENT: stable without acute flares   PLAN: Medications: continue Uloric 74m generic  Dietary precautions  Continue routine exercise.  Weight loss encouraged.   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 AH Type I   Hypercholesterolemia 04/15/2013   Last Assessment & Plan:  Formatting of this note is different from the original. ASSESSMENT: Level of control to be determined by today's lab  results  Lab Results  Component Value Date   CHOLTOTAL 109 06/08/2020   CHOLTOTAL 127 11/04/2019   CHOLTOTAL 97 07/11/2019   Lab Results  Component Value Date   HDL 48 06/08/2020   HDL 60 11/04/2019   HDL 42 07/11/2019   Lab Results  Component Value Date   L   Impaired fasting glucose 04/15/2013   Lipoma 04/15/2013   Melanocytic nevus 07/30/2017   Muscle weakness (generalized) 04/15/2013   Myopia of both  eyes 07/30/2017   Neoplasm of uncertain behavior of skin 04/15/2013   Nephrolithiasis 10/03/2018   Nicotine dependence 07/30/2017   Obesity 04/15/2013   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   Last Assessment & Plan:  Formatting of this note is different from the original. ASSESSMENT: improved & he's made adjustments   PLAN: Patient referred to and seen by Dr. Astrid Divine of [facility] on 11/13/2019.  They discussed the history symptoms and radiograph findings.  They address 3 levels of treatment.  Level 1-splinting ora   Pain in joints 04/15/2013   Panic disorder without agoraphobia 04/15/2013   Presbyopia 03/28/2016   PVD (posterior vitreous detachment), right eye 02/27/2012   Formatting of this note might be different from the original. Hemorrhagic, no RD or tears  Incomplete posterior vitreous separation, OS  Last Assessment & Plan:  Formatting of this note might be different from the original. Relevant Hx: Course: Daily Update: Today's Plan:   Rash and other nonspecific skin eruption 04/15/2013   Renal function test abnormal 04/15/2013   Sebaceous hyperplasia 11/12/2019   Formatting of this note might be different from the original. Seen at [facility] dermatology by PA Janese Banks for full skin exam on 11/20/2018.  Located on the face.  These are benign and enlarged oil glands within the skin.   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 Seen at [facility] dermatology by PA Janese Banks for full skin exam on 11/20/2018.  Located on the trunk.  Next counseled that this is a yeast infection.  Reported the rash is not bothersome and opted for no  treatment.   Urticaria 04/15/2013   Verruca plantaris 11/12/2019   Formatting of this note might be different from the original. Seen at [facility] dermatology by PA Janese Banks for full skin exam on 11/20/2018.  Located on the right lateral plantar midfoot.  Generally care on the palms and soles and are caused by viral infections.  The spread through direct contact.  Gust that there are treatment options and patient declined treatment that day.    Surgical History: Past Surgical History:  Procedure Laterality Date   EXTRACORPOREAL SHOCK WAVE LITHOTRIPSY Left 09/23/2021   Procedure: EXTRACORPOREAL SHOCK WAVE LITHOTRIPSY (ESWL);  Surgeon: Abbie Sons, MD;  Location: [facility] ORS;  Service: Urology;  Laterality: Left;   KNEE SURGERY     LIPOMA EXCISION Right    RETINAL TEAR REPAIR CRYOTHERAPY     VASECTOMY      Home Medications:  Allergies as of 10/14/2021       Reactions   Banana Anaphylaxis   Other reaction(s): Other (See Comments) Tongue swelling   Latex    Other reaction(s): Other (See Comments)  Burning sensation        Medication List        Accurate as of October 14, 2021 11:59 PM. If you have any questions, ask your nurse or doctor.          amLODipine 10 MG tablet Commonly known as: NORVASC Take 10 mg by mouth daily.   aspirin 81 MG EC tablet Take by mouth.   atorvastatin 40 MG tablet Commonly known as: LIPITOR Take 40 mg by mouth daily.   buPROPion 150 MG 24 hr tablet Commonly known as: WELLBUTRIN XL bupropion HCl XL 150 mg 24 hr tablet, extended release   chlorthalidone 25 MG tablet Commonly known as: HYGROTON chlorthalidone 25 mg tablet  TAKE 1/2 TABLET BY MOUTH ONCE DAILY   Clenpiq 10-3.5-12 MG-GM -GM/160ML Soln Generic drug: Sod Picosulfate-Mag Ox-Cit Acd Take 1 kit by mouth as directed. At 5 PM evening before procedure, drink 1 bottle of Clenpiq, hydrate, drink (5) 8 oz of water. Then do the same thing 5 hours prior to your procedure.    diazepam 2 MG tablet Commonly known as: VALIUM diazepam 2 mg tablet   diclofenac Sodium 1 % Gel Commonly known as: VOLTAREN Apply topically.   enalapril 20 MG tablet Commonly known as: VASOTEC Take 20 mg by mouth daily.   febuxostat 40 MG tablet Commonly known as: ULORIC febuxostat 40 mg tablet  TAKE 1 TABLET (40 MG TOTAL) BY MOUTH ONCE DAILY FOR GOUT FLARE PREVENTION   fluorouracil 5 % cream Commonly known as: EFUDEX fluorouracil 5 % topical cream  APPLY A THIN LAYER TWICE DAILY TO DESIGNATED AREAS FOR 2 WEEKS AS TOLERATED. ENSURE SUN PROTECTION.   gabapentin 300 MG capsule Commonly known as: NEURONTIN gabapentin 300 mg capsule   nicotine polacrilex 2 MG gum Commonly known as: NICORETTE Place inside cheek.   ondansetron 4 MG disintegrating tablet Commonly known as: ZOFRAN-ODT Take 1 tablet (4 mg total) by mouth every 8 (eight) hours as needed for nausea or vomiting.   Potassium Citrate 15 MEQ (1620 MG) Tbcr potassium citrate ER 15 mEq (1,620 mg) tablet,extended release  TAKE 1 TABLET (15 MEQ TOTAL) BY MOUTH 2 (TWO) TIMES DAILY WITH MEALS   rosuvastatin 5 MG tablet Commonly known as: CRESTOR Crestor 5 mg tablet   sildenafil 100 MG tablet Commonly known as: VIAGRA sildenafil 100 mg tablet  TAKE 1 TABLET (100 MG TOTAL) BY MOUTH ONCE DAILY AS NEEDED FOR ERECTILE DYSFUNCTION   triamcinolone cream 0.5 % Commonly known as: KENALOG Apply topically.        Allergies:  Allergies  Allergen Reactions   Banana Anaphylaxis    Other reaction(s): Other (See Comments) Tongue swelling    Latex     Other reaction(s): Other (See Comments) Burning sensation     Family History: Family History  Problem Relation Age of Onset   Heart failure Mother    Throat cancer Father    Heart disease Brother     Social History:  reports that he has never smoked. He does not have any smokeless tobacco history on file. He reports that he does not currently use alcohol. No  history on file for drug use.  ROS: Pertinent ROS in HPI  Physical Exam: BP (!) 142/81    Pulse 66    Ht 5' 9"  (1.753 m)    Wt 203 lb (92.1 kg)    BMI 29.98 kg/m   Constitutional:  Well nourished. Alert and oriented, No acute distress. HEENT: [place] AT, mask in  place.  Trachea midline Cardiovascular: No clubbing, cyanosis, or edema. Respiratory: Normal respiratory effort, no increased work of breathing. Neurologic: Grossly intact, no focal deficits, moving all 4 extremities. Psychiatric: Normal mood and affect.  Laboratory Data: Lab Results  Component Value Date   WBC 7.8 09/06/2021   HGB 15.0 09/06/2021   HCT 43.9 09/06/2021   MCV 88.5 09/06/2021   PLT 167 09/06/2021    Lab Results  Component Value Date   CREATININE 1.81 (H) 09/06/2021    Lab Results  Component Value Date   AST 23 09/06/2021   Lab Results  Component Value Date   ALT 27 09/06/2021   Cholesterol, Total mg/dL 113   Comment: The significance of total cholesterol depends on the values of individual components including HDL, LDL, non-HDL, and triglycerides.  LDL Calculated <190 mg/dL 49   Comment:   <70 mg/dL      Desired target for prior heart disease, stroke, and those at high-risk. Even lower levels may be recommended to decrease risk of heart attack and stroke.  70-159 mg/dL   Comprehensive cardiovascular risk assessment is recommended. Statin therapy may be advised based on risk factors.  160-189 mg/dL  Moderately elevated LDL level. Statin therapy recommended if other risk factors present.  >=190 mg/dL    Severely elevated LDL level. High long-term risk of heart disease and stroke. High-intensity statin therapy recommended for most people. Consider specialist referral.   *A healthy diet and exercise are recommended for all to reduce heart disease risk. Statin choice should be based on patient preference after patient-provider discussions.     Ref: 2018 ACC/AHA Guideline  HDL mg/dL 49   Comment:    People with low HDL levels (see below) are at increased risk of heart disease:  <50 mg/dL for Women  <40 mg/dL for Men  Triglyceride <500 mg/dL 76   Comment:   <150 mg/dL      Normal  150-499 mg/dL   High Triglycerides. Risk of heart disease may be increased. Address reversible causes (eg sugar in foods and beverages, alcohol, and diabetes control). Medication may be appropriate based on other clinical factors.  >=500 mg/dL     Very High Triglycerides. Risk of heart disease and pancreatitis increased. Address reversible causes as above. Medication to lower triglycerides usually advised.    *Ranges provided for adults, pediatric guidelines vary.  Resulting Agency  [facility]  Specimen Collected: 11/12/20 11:16 Last Resulted: 11/12/20 16:06  Received From: [facility]  Result Received: 09/07/21 01:25   Thyroid Stimulating Hormone (TSH) 0.34 - 5.66 IU/mL 2.35   Resulting Agency  [facility]  Specimen Collected: 11/12/20 11:16 Last Resulted: 11/12/20 15:35  Received From: [facility]  Result Received: 09/07/21 01:25   PSA (Prostate Specific Antigen), Total <=2.99 ng/mL 0.44   Comment: [place] PSA Screening algorithm, based on a multi-disciplinary consensus panel review of best reported practice in the literature.  All recommendations and treatment decisions should be made in conjunction with the patient after discussion and counseling.   If PSA >= 3.0 ng/ml, consider referral to Urology  If PSA <  3.0 ng/ml, consider screening every two years   [employer] Total-PSA Method:   The measured value of this analyte can vary depending upon the testing procedure used. Values determined on patient samples by differing testing procedures cannot be directly compared with one another, and could be cause of erroneous medical interpretation.  [facility]  Specimen  Collected: 11/12/20 11:16 Last Resulted: 11/12/20 15:54  Received From: [facility]  Result Received: 09/07/21 01:25   Hemoglobin A1C <6.5 % 5.2   Average Blood Glucose (Calculated From HgBA1c Level) mg/dL 99   Resulting Agency  [facility]  Narrative Performed by [place] The ADA has made the following Recommendations:  HBA1C results between 5.7% and 6.4% are suggestive of prediabetes  HBA1C result greater than or equal to 6.5% are diagnostic of diabetes Specimen Collected: 11/12/20 11:16 Last Resulted: 11/12/20 17:45  Received From: [facility]  Result Received: 09/07/21 01:25    Urinalysis Results for orders placed or performed in visit on 10/14/21  Microscopic Examination   Urine  Result Value Ref Range   WBC, UA 0-5 0 - 5 /hpf   RBC 0-2 0 - 2 /hpf   Epithelial Cells (non renal) 0-10 0 - 10 /hpf   Casts Present (A) None seen /lpf   Cast Type Granular casts (A) N/A   Bacteria, UA None seen None seen/Few  Urinalysis, Complete  Result Value Ref Range   Specific Gravity, UA 1.015 1.005 - 1.030   pH, UA 6.5 5.0 - 7.5   Color, UA Yellow Yellow   Appearance Ur Clear Clear   Leukocytes,UA Negative Negative   Protein,UA Negative Negative/Trace   Glucose, UA Negative Negative   Ketones, UA Negative Negative   RBC, UA Negative Negative   Bilirubin, UA Negative Negative   Urobilinogen, Ur 0.2 0.2 - 1.0 mg/dL   Nitrite, UA Negative Negative   Microscopic Examination See below:   I have reviewed the labs.   Pertinent Imaging: N/A  Assessment & Plan:    1. Nephrolithiasis -We were not able to capture a fragment, so his stone composition is unknown -We discussed things that prevent stones in a general manner, such as increasing fluid and adding citric acid to his diet -He is very interested and repeating a 71-YOVZ metabolic work-up at this time  2. Gross hematuria -Secondary to passage of  stone -Resolved  Return for [facility] report .  These notes generated with voice recognition software. I apologize for typographical errors.  Zara Council, PA-C  [facility] [address]  [place], [place] [address] [phone]  I spent 15 minutes on the day of the encounter to include pre-visit record review, face-to-face time with the patient, and post-visit ordering of tests.

## 2021-10-14 ENCOUNTER — Other Ambulatory Visit: Payer: Self-pay

## 2021-10-14 ENCOUNTER — Ambulatory Visit: Payer: Medicare PPO | Admitting: Urology

## 2021-10-14 VITALS — BP 142/81 | HR 66 | Ht 69.0 in | Wt 203.0 lb

## 2021-10-14 DIAGNOSIS — R310 Gross hematuria: Secondary | ICD-10-CM

## 2021-10-14 DIAGNOSIS — N200 Calculus of kidney: Secondary | ICD-10-CM | POA: Diagnosis not present

## 2021-10-14 LAB — MICROSCOPIC EXAMINATION: Bacteria, UA: NONE SEEN

## 2021-10-14 LAB — URINALYSIS, COMPLETE
Bilirubin, UA: NEGATIVE
Glucose, UA: NEGATIVE
Ketones, UA: NEGATIVE
Leukocytes,UA: NEGATIVE
Nitrite, UA: NEGATIVE
Protein,UA: NEGATIVE
RBC, UA: NEGATIVE
Specific Gravity, UA: 1.015 (ref 1.005–1.030)
Urobilinogen, Ur: 0.2 mg/dL (ref 0.2–1.0)
pH, UA: 6.5 (ref 5.0–7.5)

## 2021-10-17 ENCOUNTER — Encounter: Payer: Self-pay | Admitting: Urology

## 2021-10-28 ENCOUNTER — Other Ambulatory Visit: Payer: Self-pay

## 2021-10-28 ENCOUNTER — Encounter: Payer: Self-pay | Admitting: Gastroenterology

## 2021-10-28 ENCOUNTER — Encounter
Admission: RE | Disposition: A | Discharge status: Home / Self Care | Payer: Self-pay | Source: Home / Self Care | Attending: Gastroenterology

## 2021-10-28 ENCOUNTER — Ambulatory Visit: Payer: Medicare PPO | Admitting: Anesthesiology

## 2021-10-28 ENCOUNTER — Ambulatory Visit
Admission: RE | Admit: 2021-10-28 | Discharge: 2021-10-28 | Disposition: A | Discharge status: Home / Self Care | Payer: Medicare PPO | Attending: Gastroenterology | Admitting: Gastroenterology

## 2021-10-28 DIAGNOSIS — Z1211 Encounter for screening for malignant neoplasm of colon: Secondary | ICD-10-CM | POA: Diagnosis present

## 2021-10-28 DIAGNOSIS — I10 Essential (primary) hypertension: Secondary | ICD-10-CM | POA: Diagnosis not present

## 2021-10-28 DIAGNOSIS — K5730 Diverticulosis of large intestine without perforation or abscess without bleeding: Secondary | ICD-10-CM | POA: Diagnosis not present

## 2021-10-28 HISTORY — PX: COLONOSCOPY WITH PROPOFOL: SHX5780

## 2021-10-28 HISTORY — DX: Personal history of urinary calculi: Z87.442

## 2021-10-28 SURGERY — COLONOSCOPY WITH PROPOFOL
Anesthesia: General

## 2021-10-28 MED ORDER — SODIUM CHLORIDE 0.9 % IV SOLN: INTRAVENOUS | Status: DC

## 2021-10-28 MED ORDER — PROPOFOL 10 MG/ML IV BOLUS
INTRAVENOUS | Status: AC
  Filled 2021-10-28: qty 40

## 2021-10-28 MED ORDER — PROPOFOL 10 MG/ML IV BOLUS
INTRAVENOUS | Status: DC | PRN
Start: 2021-10-28 — End: 2021-10-28
  Administered 2021-10-28: 150 mg via INTRAVENOUS
  Administered 2021-10-28: 70 mg via INTRAVENOUS
  Administered 2021-10-28: 100 mg via INTRAVENOUS

## 2021-10-28 NOTE — H&P (Signed)
Charles Bellows, MD [address] [place], [place], [place], [address] [address], [place], [place], [place], [address] Phone: [phone]  Fax: [phone]  Primary Care Physician:  Charles Pier, MD   Pre-Procedure History & Physical: HPI:  Charles Waters is a 71 y.o. male is here for an colonoscopy.   Past Medical History:  Diagnosis Date   Abdominal hernia 04/15/2013   Abnormal weight gain 04/15/2013   Actinic keratoses 11/12/2019   Formatting of this note might be different from the original. Seen at [facility] dermatology by PA Charles Waters for full skin exam on 11/20/2018.  Distributed on the face.  These were precancerous proliferations that can occur with sun damaged skin.  Also subset of AK's can develop into squamous cell carcinoma.  She discussed treatment with cryotherapy versus 5-fluorouracil.  Recommended field tr   Anxiety state 04/15/2013   Benign essential hypertension 04/15/2013   Last Assessment & Plan:  Formatting of this note might be different from the original. ASSESSMENT: Blood pressure is well controlled here today.  Home BP readings range: no concerning readings   PLAN:  Medications: continue Amlodipine, Enalapril. -  Labs as per orders if indicated/due Check & log BP and bring readings to f/u and machine.  Low sodium/DASH diet is advised. Consistent aerobic exercis   Cataract 03/28/2012   Contact dermatitis and other eczema, due to unspecified cause 04/15/2013   Depression 41/66/0630   Dysmetabolic syndrome X 16/09/930   Family history of congestive heart failure 07/30/2017   Formatting of this note might be different from the original. 03/28/2016:  Mother & identical twin brother both with CHF. Identical twin much self inflicted-alcoholism, smoking, drugs, etc.  We discuss utility of BNP testing and/or cardiac consult. I advise the latter for full evaluation as Charles Waters has RFs certainly with HTN & HL, white male of 64yoa. He is obese by BMI despite his  routine CV exercise    Gout    Gout, unspecified 04/15/2013   Last Assessment & Plan:  Formatting of this note might be different from the original. ASSESSMENT: stable without acute flares   PLAN: Medications: continue Uloric 61m generic  Dietary precautions  Continue routine exercise.  Weight loss encouraged.   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 AH Type I   Hypercholesterolemia 04/15/2013   Last Assessment & Plan:  Formatting of this note is different from the original. ASSESSMENT: Level of control to be determined by today's lab results  Lab Results  Component Value Date   CHOLTOTAL 109 06/08/2020   CHOLTOTAL 127 11/04/2019   CHOLTOTAL 97 07/11/2019   Lab Results  Component Value Date   HDL 48 06/08/2020   HDL 60 11/04/2019   HDL 42 07/11/2019   Lab Results  Component Value Date   L   Impaired fasting glucose 04/15/2013   Lipoma 04/15/2013   Melanocytic nevus 07/30/2017   Muscle weakness (generalized) 04/15/2013   Myopia of both eyes 07/30/2017   Neoplasm of uncertain behavior of skin 04/15/2013   Nephrolithiasis 10/03/2018   Nicotine dependence 07/30/2017   Obesity 04/15/2013   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   Last Assessment & Plan:  Formatting of this note is different from the original. ASSESSMENT: improved & he's made adjustments   PLAN: Patient referred to and  seen by Dr. Astrid Waters of [facility] on 11/13/2019.  They discussed the history symptoms and radiograph findings.  They address 3 levels of treatment.  Level 1-splinting ora   Pain in joints 04/15/2013   Panic disorder without agoraphobia 04/15/2013   Presbyopia 03/28/2016   PVD (posterior vitreous detachment), right eye 02/27/2012   Formatting of this note might be different from the original. Hemorrhagic, no RD or tears  Incomplete  posterior vitreous separation, OS  Last Assessment & Plan:  Formatting of this note might be different from the original. Relevant Hx: Course: Daily Update: Today's Plan:   Rash and other nonspecific skin eruption 04/15/2013   Renal function test abnormal 04/15/2013   Sebaceous hyperplasia 11/12/2019   Formatting of this note might be different from the original. Seen at [facility] dermatology by PA Charles Waters for full skin exam on 11/20/2018.  Located on the face.  These are benign and enlarged oil glands within the skin.   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 Seen at [facility] dermatology by PA Charles Waters for full skin exam on 11/20/2018.  Located on the trunk.  Next counseled that this is a yeast infection.  Reported the rash is not bothersome and opted for no treatment.   Urticaria 04/15/2013   Verruca plantaris 11/12/2019   Formatting of this note might be different from the original. Seen at [facility] dermatology by PA Charles Waters for full skin exam on 11/20/2018.  Located on the right lateral plantar midfoot.  Generally care on the palms and soles and are caused by viral infections.  The spread through direct contact.  Gust that there are treatment options and patient declined treatment that day.    Past Surgical History:  Procedure Laterality Date   EXTRACORPOREAL SHOCK WAVE LITHOTRIPSY Left 09/23/2021   Procedure: EXTRACORPOREAL SHOCK WAVE LITHOTRIPSY (ESWL);  Surgeon: Charles Sons, MD;  Location: [facility] ORS;  Service: Urology;  Laterality: Left;   KNEE SURGERY     LIPOMA EXCISION Right    RETINAL TEAR REPAIR CRYOTHERAPY     VASECTOMY      Prior to Admission medications   Medication Sig Start Date End Date Taking?  Authorizing Provider  amLODipine (NORVASC) 10 MG tablet Take 10 mg by mouth daily. 07/08/21  Yes [provider]  aspirin 81 MG EC tablet Take by mouth. 08/26/04  Yes [provider]  atorvastatin (LIPITOR) 40 MG tablet Take 40 mg by mouth daily. 03/28/21  Yes [provider]  diazepam (VALIUM) 2 MG tablet diazepam 2 mg tablet 02/18/14  Yes [provider]  diclofenac Sodium (VOLTAREN) 1 % GEL Apply topically.   Yes [provider]  enalapril (VASOTEC) 20 MG tablet Take 20 mg by mouth daily. 09/03/21  Yes [provider]  gabapentin (NEURONTIN) 300 MG capsule gabapentin 300 mg capsule 03/26/06  Yes [provider]  nicotine polacrilex (NICORETTE) 2 MG gum Place inside cheek.   Yes [provider]  ondansetron (ZOFRAN-ODT) 4 MG disintegrating tablet Take 1 tablet (4 mg total) by mouth every 8 (eight) hours as needed for nausea or vomiting. 09/07/21  Yes Vanessa , MD  Potassium Citrate 15 MEQ (1620 MG) TBCR potassium citrate ER 15 mEq (1,620 mg) tablet,extended release  TAKE 1  TABLET (15 MEQ TOTAL) BY MOUTH 2 (TWO) TIMES DAILY WITH MEALS   Yes [provider]  sildenafil (VIAGRA) 100 MG tablet sildenafil 100 mg tablet  TAKE 1 TABLET (100 MG TOTAL) BY MOUTH ONCE DAILY AS NEEDED FOR ERECTILE DYSFUNCTION   Yes [provider]  triamcinolone cream (KENALOG) 0.5 % Apply topically.   Yes [provider]  buPROPion (WELLBUTRIN XL) 150 MG 24 hr tablet bupropion HCl XL 150 mg 24 hr tablet, extended release    [provider]  chlorthalidone (HYGROTON) 25 MG tablet     [provider]  febuxostat (ULORIC) 40 MG tablet febuxostat 40 mg tablet  TAKE 1 TABLET (40 MG TOTAL) BY MOUTH ONCE DAILY FOR GOUT FLARE PREVENTION 08/26/10   [provider]  fluorouracil (EFUDEX) 5 % cream     [provider]  rosuvastatin (CRESTOR) 5 MG tablet Crestor 5 mg tablet    [provider]   Sod Picosulfate-Mag Ox-Cit Acd (CLENPIQ) 10-3.5-12 MG-GM -GM/160ML SOLN Take 1 kit by mouth as directed. At 5 PM evening before procedure, drink 1 bottle of Clenpiq, hydrate, drink (5) 8 oz of water. Then do the same thing 5 hours prior to your procedure. 10/05/21   Charles Bellows, MD    Allergies as of 10/06/2021 - Review Complete 10/05/2021  Allergen Reaction Noted   Banana Anaphylaxis 01/07/2011   Latex  07/28/2017    Family History  Problem Relation Age of Onset   Heart failure Mother    Throat cancer Father    Heart disease Brother     Social History   Socioeconomic History   Marital status: Married    Spouse name: Not on file   Number of children: Not on file   Years of education: Not on file   Highest education level: Not on file  Occupational History   Not on file  Tobacco Use   Smoking status: Never   Smokeless tobacco: Never  Vaping Use   Vaping Use: Never used  Substance and Sexual Activity   Alcohol use: Not Currently   Drug use: Never   Sexual activity: Not on file  Other Topics Concern   Not on file  Social History Narrative   Not on file   Social Determinants of Health   Financial Resource Strain: Not on file  Food Insecurity: Not on file  Transportation Needs: Not on file  Physical Activity: Not on file  Stress: Not on file  Social Connections: Not on file  Intimate Partner Violence: Not on file    Review of Systems: See HPI, otherwise negative ROS  Physical Exam: BP (!) 147/84    Pulse 76    Temp 97.8 F (36.6 C) (Temporal)    Resp 18    Ht 5' 9" (1.753 m)    Wt 93 kg    SpO2 98%    BMI 30.27 kg/m  General:   Alert,  pleasant and cooperative in NAD Head:  Normocephalic and atraumatic. Neck:  Supple; no masses or thyromegaly. Lungs:  Clear throughout to auscultation, normal respiratory effort.    Heart:  +S1, +S2, Regular rate and rhythm, No edema. Abdomen:  Soft, nontender and nondistended. Normal bowel sounds, without guarding, and  without rebound.   Neurologic:  Alert and  oriented x4;  grossly normal neurologically.  Impression/Plan: Charles Waters is here for an colonoscopy to be performed for Screening colonoscopy average risk   Risks, benefits, limitations, and alternatives regarding  colonoscopy have been reviewed  with the patient.  Questions have been answered.  All parties agreeable.   Charles Bellows, MD  10/28/2021, 8:46 AM

## 2021-10-28 NOTE — Op Note (Signed)
[facility] Gastroenterology Patient Name: Charles Waters Procedure Date: 10/28/2021 8:47 AM MRN: [id number] Account #: [id number] Date of Birth: May 04, 1951 Admit Type: Outpatient Age: 71 Room: [facility] ENDO ROOM 3 Gender: Male Note Status: Finalized Instrument Name: Park Meo [id number] Procedure:             Colonoscopy Indications:           Screening for colorectal malignant neoplasm Providers:             [name] MD, MD Referring MD:          Mardene Celeste (Referring MD) Medicines:             Monitored Anesthesia Care Complications:         No immediate complications. Procedure:             Pre-Anesthesia Assessment:                        - Prior to the procedure, a History and Physical was                         performed, and patient medications, allergies and                         sensitivities were reviewed. The patient's tolerance                         of previous anesthesia was reviewed.                        - The risks and benefits of the procedure and the                         sedation options and risks were discussed with the                         patient. All questions were answered and informed                         consent was obtained.                        - ASA Grade Assessment: II - A patient with mild                         systemic disease.                        After obtaining informed consent, the colonoscope was                         passed under direct vision. Throughout the procedure,                         the patient's blood pressure, pulse, and oxygen                         saturations were monitored continuously. The                         Colonoscope was introduced through  the anus and                         advanced to the the cecum, identified by the                         appendiceal orifice. The colonoscopy was performed                         with ease. The patient tolerated the procedure well.                          The quality of the bowel preparation was good. Findings:      The perianal and digital rectal examinations were normal.      Multiple small-mouthed diverticula were found in the sigmoid colon.      The exam was otherwise without abnormality on direct and retroflexion       views. Impression:            - Diverticulosis in the sigmoid colon.                        - The examination was otherwise normal on direct and                         retroflexion views.                        - No specimens collected. Recommendation:        - Discharge patient to home (with escort).                        - Resume previous diet.                        - Continue present medications.                        - Repeat colonoscopy is not recommended due to current                         age (30 years or older) for screening purposes. Procedure Code(s):     --- Professional ---                        [phone], Colonoscopy, flexible; diagnostic, including                         collection of specimen(s) by brushing or washing, when                         performed (separate procedure) Diagnosis Code(s):     --- Professional ---                        Z12.11, Encounter for screening for malignant neoplasm                         of colon  K57.30, Diverticulosis of large intestine without                         perforation or abscess without bleeding CPT copyright 2019 American Medical Association. All rights reserved. The codes documented in this report are preliminary and upon coder review may  be revised to meet current compliance requirements. [name], MD [name] MD, MD 10/28/2021 9:16:10 AM This report has been signed electronically. Number of Addenda: 0 Note Initiated On: 10/28/2021 8:47 AM Scope Withdrawal Time: 0 hours 14 minutes 3 seconds  Total Procedure Duration: 0 hours 18 minutes 26 seconds  Estimated Blood Loss:  Estimated blood loss: none.       [facility]

## 2021-10-28 NOTE — Anesthesia Postprocedure Evaluation (Signed)
Anesthesia Post Note  Patient: Charles Waters  Procedure(s) Performed: COLONOSCOPY WITH PROPOFOL  Patient location during evaluation: Phase II Anesthesia Type: General Level of consciousness: awake and alert, awake and oriented Pain management: pain level controlled Vital Signs Assessment: post-procedure vital signs reviewed and stable Respiratory status: spontaneous breathing, nonlabored ventilation and respiratory function stable Cardiovascular status: blood pressure returned to baseline and stable Postop Assessment: no apparent nausea or vomiting Anesthetic complications: no   No notable events documented.   Last Vitals:  Vitals:   10/28/21 0929 10/28/21 0939  BP: 115/81 125/80  Pulse: 64 (!) 58  Resp: 16 13  Temp:    SpO2: 98% 99%    Last Pain:  Vitals:   10/28/21 0939  TempSrc:   PainSc: 0-No pain                 Phill Mutter

## 2021-10-28 NOTE — Anesthesia Preprocedure Evaluation (Signed)
Anesthesia Evaluation  Patient identified by MRN, date of birth, ID band Patient awake    Reviewed: Allergy & Precautions, NPO status , Patient's Chart, lab work & pertinent test results  Airway Mallampati: II  TM Distance: >3 FB Neck ROM: Full    Dental no notable dental hx. (+) Implants   Pulmonary neg pulmonary ROS,    Pulmonary exam normal        Cardiovascular hypertension, Pt. on medications negative cardio ROS Normal cardiovascular exam     Neuro/Psych PSYCHIATRIC DISORDERS Anxiety Depression negative neurological ROS     GI/Hepatic negative GI ROS, Neg liver ROS,   Endo/Other  negative endocrine ROS  Renal/GU Renal disease  negative genitourinary   Musculoskeletal  (+) Arthritis , Osteoarthritis,    Abdominal   Peds negative pediatric ROS (+)  Hematology negative hematology ROS (+)   Anesthesia Other Findings   Reproductive/Obstetrics negative OB ROS                             Anesthesia Physical Anesthesia Plan  ASA: 2  Anesthesia Plan: General   Post-op Pain Management:    Induction: Intravenous  PONV Risk Score and Plan: 2 and TIVA and Midazolam  Airway Management Planned: Natural Airway and Nasal Cannula  Additional Equipment:   Intra-op Plan:   Post-operative Plan:   Informed Consent: I have reviewed the patients History and Physical, chart, labs and discussed the procedure including the risks, benefits and alternatives for the proposed anesthesia with the patient or authorized representative who has indicated his/her understanding and acceptance.       Plan Discussed with: CRNA, Anesthesiologist and Surgeon  Anesthesia Plan Comments:         Anesthesia Quick Evaluation

## 2021-10-28 NOTE — Transfer of Care (Signed)
Immediate Anesthesia Transfer of Care Note  Patient: Charles Waters  Procedure(s) Performed: COLONOSCOPY WITH PROPOFOL  Patient Location: Endoscopy Unit  Anesthesia Type:General  Level of Consciousness: awake and drowsy  Airway & Oxygen Therapy: Patient Spontanous Breathing  Post-op Assessment: Report given to RN and Post -op Vital signs reviewed and stable  Post vital signs: Reviewed and stable  Last Vitals:  Vitals Value Taken Time  BP 127/75 10/28/21 0919  Temp 36.1 C 10/28/21 0919  Pulse 64 10/28/21 0920  Resp 14 10/28/21 0920  SpO2 99 % 10/28/21 0920    Last Pain:  Vitals:   10/28/21 0919  TempSrc: Temporal  PainSc: Asleep         Complications: No notable events documented.

## 2022-05-09 ENCOUNTER — Encounter: Payer: Self-pay | Admitting: Family Medicine

## 2022-05-09 ENCOUNTER — Ambulatory Visit: Payer: Medicare PPO | Admitting: Family Medicine

## 2022-05-09 VITALS — BP 128/76 | HR 81 | Temp 98.5°F | Ht 69.0 in | Wt 214.6 lb

## 2022-05-09 DIAGNOSIS — Z1159 Encounter for screening for other viral diseases: Secondary | ICD-10-CM

## 2022-05-09 DIAGNOSIS — M62838 Other muscle spasm: Secondary | ICD-10-CM

## 2022-05-09 DIAGNOSIS — E7800 Pure hypercholesterolemia, unspecified: Secondary | ICD-10-CM

## 2022-05-09 DIAGNOSIS — E669 Obesity, unspecified: Secondary | ICD-10-CM

## 2022-05-09 DIAGNOSIS — I10 Essential (primary) hypertension: Secondary | ICD-10-CM

## 2022-05-09 DIAGNOSIS — Z6831 Body mass index (BMI) 31.0-31.9, adult: Secondary | ICD-10-CM

## 2022-05-09 DIAGNOSIS — M1A9XX0 Chronic gout, unspecified, without tophus (tophi): Secondary | ICD-10-CM | POA: Diagnosis not present

## 2022-05-09 DIAGNOSIS — R7301 Impaired fasting glucose: Secondary | ICD-10-CM | POA: Diagnosis not present

## 2022-05-09 DIAGNOSIS — D225 Melanocytic nevi of trunk: Secondary | ICD-10-CM

## 2022-05-09 MED ORDER — TIZANIDINE HCL 4 MG PO TABS: 4.0000 mg | ORAL_TABLET | Freq: Three times a day (TID) | ORAL | 0 refills | Status: DC | PRN

## 2022-05-09 NOTE — Progress Notes (Signed)
    SUBJECTIVE:   CHIEF COMPLAINT / HPI: to establish care  Patient presents to clinic today to establish care with new PCP  History of moles Patient is concerned for multiple moles distributed throughout his body.  Denies any increase in size, change in color or shape.  No previous family history of melanoma or cancer.  History of sun exposure.  Would like dermatology referral.  Hypertension Asymptomatic.  Compliant with medications.  Denies any headaches, visual changes, chest pain, shortness of breath, abdominal pain or lower extremity edema.  Currently on Norvasc, Vasotec, potassium.  Reports chlorthalidone was discontinued.  Muscle spasms Reports history of muscle spasms.  Was taking Valium 2 mg as needed.  Has decreased to 1 mg as needed.  Reports has not taken medication in a long time.  Hyperlipidemia Previously on Crestor, was discontinued.  Currently on Lipitor 40 mg daily.  Compliant with medication and tolerating well.  Nicoderm use Chews Nicorette gum daily.   PERTINENT  PMH / PSH:  Hypertension Hyperlipidemia Nephrolithiasis Osteoarthritis bilateral wrist Gout  Tonsillectomy Vasectomy Neuropathy status post vasectomy Right lipoma removal  Mom, heart failure Dad, heart failure, CVA, throat CA  No tobacco use No EtOH use NOT THC/cocaine/heroin  OBJECTIVE:   BP 128/76 (BP Location: Left Arm, Patient Position: Sitting, Cuff Size: Normal)   Pulse 81   Temp 98.5 F (36.9 C) (Oral)   Ht '5\' 9"'$  (1.753 m)   Wt 214 lb 9.6 oz (97.3 kg)   SpO2 98%   BMI 31.69 kg/m    General: Alert, no acute distress Cardio: Normal S1 and S2, RRR, no r/m/g Pulm: CTAB, normal work of breathing Abdomen: Bowel sounds normal. Abdomen soft and non-tender.  Extremities: No peripheral edema.  Neuro: Cranial nerves grossly intact Derm: Multiple melanocytic nevi         ASSESSMENT/PLAN:   Melanocytic nevus Referral to dermatology for evaluation.  Gout,  unspecified Asymptomatic. -Uric acid level today -Continue Febuxostat to state 40 mg daily  Hypercholesterolemia Lipid panel today -Continue Lipitor 40 mg daily  Benign essential hypertension Well-controlled -CMET today -Continue Norvasc 10 mg daily -Continue Vasotec 20 mg daily -Discontinue potassium citrate, patient reports was not taking this -Discontinue potassium chloride, patient reports that he was taking 99 mEq daily -Follow-up in 2 months   Spasm of muscle Asymptomatic today. Discontinue Valium -Can trial tizanidine 4 mg 3 times daily as needed -Follow-up with PCP as needed.  Obesity BMI greater than 31.  Patient currently works out at [employer] with weightlifting and eats healthy. -A1c today, CMET, lipids and CBC today -Continue current lifestyle management  Encounter for hepatitis C screening test for low risk patient Hepatitis C labs    PDMP reviewed  Carollee Leitz, MD

## 2022-05-09 NOTE — Patient Instructions (Addendum)
It was a pleasure meeting you today. Thank you for allowing me to take part in your health care.  Our goals for today as we discussed include:  We will get some labs today.  If they are abnormal or we need to do something about them, I will call you.  If they are normal, I will send you a message on MyChart (if it is active) or a letter in the mail.  If you don't hear from [place] in 2 weeks, please call the office at the number below.   Referral has been sent to dermatology.  Please call clinic if you do not have an appointment scheduled within the next couple of weeks.  Stop taking diazepam as needed. I have ordered tizanidine 4 mg.  You can take this 3 times a day as needed for muscle spasm.    Please follow-up with PCP in 2 months  If you have any questions or concerns, please do not hesitate to call the office at [phone].  I look forward to our next visit and until then take care and stay safe.  Regards,   Carollee Leitz, MD   [facility]

## 2022-05-10 LAB — LIPID PANEL
Cholesterol: 115 mg/dL (ref 0–200)
HDL: 42.1 mg/dL (ref >39.00)
LDL Cholesterol: 40 mg/dL (ref 0–99)
NonHDL: 72.95
Total CHOL/HDL Ratio: 3
Triglycerides: 166 mg/dL — ABNORMAL HIGH (ref 0.0–149.0)
VLDL: 33.2 mg/dL (ref 0.0–40.0)

## 2022-05-10 LAB — CBC
HCT: 43.6 % (ref 39.0–52.0)
Hemoglobin: 14.9 g/dL (ref 13.0–17.0)
MCHC: 34 g/dL (ref 30.0–36.0)
MCV: 89.8 fl (ref 78.0–100.0)
Platelets: 168 10*3/uL (ref 150.0–400.0)
RBC: 4.86 Mil/uL (ref 4.22–5.81)
RDW: 13.5 % (ref 11.5–15.5)
WBC: 6.8 10*3/uL (ref 4.0–10.5)

## 2022-05-10 LAB — COMPREHENSIVE METABOLIC PANEL
ALT: 18 U/L (ref 0–53)
AST: 20 U/L (ref 0–37)
Albumin: 4.3 g/dL (ref 3.5–5.2)
Alkaline Phosphatase: 74 U/L (ref 39–117)
BUN: 14 mg/dL (ref 6–23)
CO2: 29 mEq/L (ref 19–32)
Calcium: 10.4 mg/dL (ref 8.4–10.5)
Chloride: 104 mEq/L (ref 96–112)
Creatinine, Ser: 1.32 mg/dL (ref 0.40–1.50)
GFR: 54.81 mL/min — ABNORMAL LOW (ref >60.00)
Glucose, Bld: 106 mg/dL — ABNORMAL HIGH (ref 70–99)
Potassium: 4.2 mEq/L (ref 3.5–5.1)
Sodium: 141 mEq/L (ref 135–145)
Total Bilirubin: 0.6 mg/dL (ref 0.2–1.2)
Total Protein: 6.7 g/dL (ref 6.0–8.3)

## 2022-05-10 LAB — URIC ACID: Uric Acid, Serum: 4.1 mg/dL (ref 4.0–7.8)

## 2022-05-10 LAB — HEPATITIS C ANTIBODY: Hepatitis C Ab: NONREACTIVE

## 2022-05-10 LAB — HEMOGLOBIN A1C: Hgb A1c MFr Bld: 5.4 % (ref 4.6–6.5)

## 2022-05-12 ENCOUNTER — Telehealth: Payer: Self-pay

## 2022-05-12 NOTE — Progress Notes (Signed)
Spoke to Patient to let him know that his Triglycerides are high and Dr. Volanda Napoleon recommends monitoring his diet and to continue exercising. Patient verbalized understanding and said when he comes back to his next office visit he will be lighter.

## 2022-05-12 NOTE — Telephone Encounter (Signed)
Spoke to Patient to let him know that his Triglycerides are high and Dr. Volanda Napoleon recommends monitoring his diet and to continue exercising. Patient verbalized understanding and said when he comes back to his next office visit he will be lighter.

## 2022-05-12 NOTE — Progress Notes (Signed)
Spoke to Patient to let him know that his Triglycerides are high and Dr. Volanda Napoleon recommends monitoring his diet and to continue exercising. Patient verbalized understanding and said when he comes back to his next office visit he will be lighter

## 2022-05-12 NOTE — Telephone Encounter (Signed)
Spoke to Pt. About labs

## 2022-05-15 ENCOUNTER — Encounter: Payer: Self-pay | Admitting: Family Medicine

## 2022-05-15 DIAGNOSIS — Z1159 Encounter for screening for other viral diseases: Secondary | ICD-10-CM | POA: Insufficient documentation

## 2022-05-15 NOTE — Assessment & Plan Note (Signed)
Well-controlled -CMET today -Continue Norvasc 10 mg daily -Continue Vasotec 20 mg daily -Discontinue potassium citrate, patient reports was not taking this -Discontinue potassium chloride, patient reports that he was taking 99 mEq daily -Follow-up in 2 months

## 2022-05-15 NOTE — Assessment & Plan Note (Signed)
Referral to dermatology for evaluation.

## 2022-05-15 NOTE — Assessment & Plan Note (Signed)
Asymptomatic today. Discontinue Valium -Can trial tizanidine 4 mg 3 times daily as needed -Follow-up with PCP as needed.

## 2022-05-15 NOTE — Assessment & Plan Note (Signed)
Hepatitis C labs

## 2022-05-15 NOTE — Assessment & Plan Note (Addendum)
Asymptomatic. -Uric acid level today -Continue Febuxostat to state 40 mg daily

## 2022-05-15 NOTE — Assessment & Plan Note (Signed)
Lipid panel today -Continue Lipitor 40 mg daily

## 2022-05-15 NOTE — Assessment & Plan Note (Signed)
BMI greater than 31.  Patient currently works out at [employer] with weightlifting and eats healthy. -A1c today, CMET, lipids and CBC today -Continue current lifestyle management

## 2022-05-22 ENCOUNTER — Other Ambulatory Visit: Payer: Self-pay

## 2022-05-22 ENCOUNTER — Emergency Department: Payer: Medicare PPO

## 2022-05-22 ENCOUNTER — Ambulatory Visit
Admission: RE | Admit: 2022-05-22 | Discharge: 2022-05-22 | Disposition: A | Discharge status: Other Acute Inpatient Hospital | Payer: Medicare PPO | Source: Ambulatory Visit | Attending: Family Medicine | Admitting: Family Medicine

## 2022-05-22 ENCOUNTER — Ambulatory Visit (INDEPENDENT_AMBULATORY_CARE_PROVIDER_SITE_OTHER): Payer: Medicare PPO

## 2022-05-22 VITALS — BP 145/89 | HR 84 | Temp 98.4°F | Resp 14 | Ht 69.0 in | Wt 207.5 lb

## 2022-05-22 DIAGNOSIS — K8020 Calculus of gallbladder without cholecystitis without obstruction: Secondary | ICD-10-CM | POA: Diagnosis not present

## 2022-05-22 DIAGNOSIS — R109 Unspecified abdominal pain: Secondary | ICD-10-CM | POA: Diagnosis present

## 2022-05-22 DIAGNOSIS — K56609 Unspecified intestinal obstruction, unspecified as to partial versus complete obstruction: Secondary | ICD-10-CM

## 2022-05-22 DIAGNOSIS — K529 Noninfective gastroenteritis and colitis, unspecified: Secondary | ICD-10-CM | POA: Diagnosis not present

## 2022-05-22 LAB — CBC WITH DIFFERENTIAL/PLATELET
Abs Immature Granulocytes: 0.02 10*3/uL (ref 0.00–0.07)
Basophils Absolute: 0 10*3/uL (ref 0.0–0.1)
Basophils Relative: 0 %
Eosinophils Absolute: 0.1 10*3/uL (ref 0.0–0.5)
Eosinophils Relative: 1 %
HCT: 42.9 % (ref 39.0–52.0)
Hemoglobin: 14.9 g/dL (ref 13.0–17.0)
Immature Granulocytes: 0 %
Lymphocytes Relative: 22 %
Lymphs Abs: 1.7 10*3/uL (ref 0.7–4.0)
MCH: 31 pg (ref 26.0–34.0)
MCHC: 34.7 g/dL (ref 30.0–36.0)
MCV: 89.2 fL (ref 80.0–100.0)
Monocytes Absolute: 0.9 10*3/uL (ref 0.1–1.0)
Monocytes Relative: 12 %
Neutro Abs: 4.9 10*3/uL (ref 1.7–7.7)
Neutrophils Relative %: 65 %
Platelets: 172 10*3/uL (ref 150–400)
RBC: 4.81 MIL/uL (ref 4.22–5.81)
RDW: 12.7 % (ref 11.5–15.5)
WBC: 7.7 10*3/uL (ref 4.0–10.5)
nRBC: 0 % (ref 0.0–0.2)

## 2022-05-22 LAB — COMPREHENSIVE METABOLIC PANEL
ALT: 104 U/L — ABNORMAL HIGH (ref 0–44)
AST: 33 U/L (ref 15–41)
Albumin: 3.8 g/dL (ref 3.5–5.0)
Alkaline Phosphatase: 66 U/L (ref 38–126)
Anion gap: 7 (ref 5–15)
BUN: 22 mg/dL (ref 8–23)
CO2: 30 mmol/L (ref 22–32)
Calcium: 10.5 mg/dL — ABNORMAL HIGH (ref 8.9–10.3)
Chloride: 102 mmol/L (ref 98–111)
Creatinine, Ser: 1.38 mg/dL — ABNORMAL HIGH (ref 0.61–1.24)
GFR, Estimated: 55 mL/min — ABNORMAL LOW (ref >60)
Glucose, Bld: 90 mg/dL (ref 70–99)
Potassium: 3.8 mmol/L (ref 3.5–5.1)
Sodium: 139 mmol/L (ref 135–145)
Total Bilirubin: 1.2 mg/dL (ref 0.3–1.2)
Total Protein: 7.3 g/dL (ref 6.5–8.1)

## 2022-05-22 LAB — LIPASE, BLOOD: Lipase: 23 U/L (ref 11–51)

## 2022-05-22 MED ORDER — IOHEXOL 300 MG/ML  SOLN
100.0000 mL | Freq: Once | INTRAMUSCULAR | Status: AC | PRN
  Administered 2022-05-22: 100 mL via INTRAVENOUS

## 2022-05-22 NOTE — ED Notes (Signed)
Patient is being discharged from the Urgent Care and sent to the Emergency Department via POV . Per Dr. Susa Simmonds, patient is in need of higher level of care due to SBO. Patient is aware and verbalizes understanding of plan of care.  Vitals:   05/22/22 1505  BP: (!) 145/89  Pulse: 84  Resp: 14  Temp: 98.4 F (36.9 C)  SpO2: 99%

## 2022-05-22 NOTE — ED Triage Notes (Signed)
Pt states was sent over from urgent care for ct scan to r/o small bowel obstruction. Pt appears in no acute distress, states symptoms have been getting better since Tuesday.

## 2022-05-22 NOTE — Discharge Instructions (Signed)

## 2022-05-22 NOTE — ED Provider Notes (Addendum)
[facility]    CSN: [id number] Arrival date & time: 05/22/22  1453      History   Chief Complaint Chief Complaint  Patient presents with   Abdominal Pain   Constipation    HPI Charles Waters is a 71 y.o. male.   HPI  Charles Waters presents for abdominal pain with associated constipation and diarrhea since Monday.  Reports loose stools/diarrhea therefore he took 1 tablet of Imodium and then was not able to have a bowel movement until Thursday.  From Tuesday to Thursday he was only able to eat small amounts of rice and crackers.  States that he normally drinks 2 cups of coffee in the mornings but has only been able to drink maybe a half a couple coffee.  Diarrhea has improved however the bloating continues every time he tries to eat.  He has been having hiccups and notices his stomach starts bloating again.  His daughter was concerned that his symptoms were ongoing and told him to go get checked out.  Of note, his daughter has Crohn's disease and his sister has rheumatoid arthritis.  He has been taking omeprazole for acid reflux.  Thursday, he had to sleep in the lounge chair in order for it to not flare.  He has started to get hungry and was able to eat some lean [place] but then he said he bloated up so bad so he had to stop.  This morning he was able to eat blueberries and a bagel.  Gas-X has been helping with his bloating.  He has lost 5 pounds since Monday.  He had no previous surgeries on his abdomen.   Symptoms Nausea/Vomiting: yes Wed night, no vomiting  Diarrhea: yes  Constipation: yes  Melena/BRBPR: no  Hematemesis: no  Anorexia: yes  Fever/Chills: yes nails, no fever Dysuria: no  Rash: no  Wt loss: yes  5 lb since Monday  EtOH use: no  NSAIDs/ASA: yes - Naprosyn for intermittent back pain  Sore throat: no   Cough: no Nasal congestion : no  Sleep disturbance: Yes due to reflux,  Back Pain: yes (intermittent, chronic) Headache: no   Past Medical History:   Diagnosis Date   Abdominal hernia 04/15/2013   Abnormal weight gain 04/15/2013   Abnormal weight gain 04/15/2013   Actinic keratoses 11/12/2019   Formatting of this note might be different from the original. Seen at [facility] dermatology by PA Janese Banks for full skin exam on 11/20/2018.  Distributed on the face.  These were precancerous proliferations that can occur with sun damaged skin.  Also subset of AK's can develop into squamous cell carcinoma.  She discussed treatment with cryotherapy versus 5-fluorouracil.  Recommended field tr   Anxiety state 04/15/2013   Anxiety state 04/15/2013   Benign essential hypertension 04/15/2013   Last Assessment & Plan:  Formatting of this note might be different from the original. ASSESSMENT: Blood pressure is well controlled here today.  Home BP readings range: no concerning readings   PLAN:  Medications: continue Amlodipine, Enalapril. -  Labs as per orders if indicated/due Check & log BP and bring readings to f/u and machine.  Low sodium/DASH diet is advised. Consistent aerobic exercis   Cataract 03/28/2012   Contact dermatitis and other eczema, due to unspecified cause 04/15/2013   Contact dermatitis and other eczema, due to unspecified cause 04/15/2013   Depression 18/84/1660   Dysmetabolic syndrome X 63/09/6008   Family history of congestive heart failure 07/30/2017   Formatting of this  note might be different from the original. 03/28/2016:  Mother & identical twin brother both with CHF. Identical twin much self inflicted-alcoholism, smoking, drugs, etc.  We discuss utility of BNP testing and/or cardiac consult. I advise the latter for full evaluation as Nickolus has RFs certainly with HTN & HL, white male of 64yoa. He is obese by BMI despite his routine CV exercise    Gout    Gout, unspecified 04/15/2013   Last Assessment & Plan:  Formatting of this note might be different from the original. ASSESSMENT: stable without acute flares   PLAN: Medications:  continue Uloric '40mg'$  generic  Dietary precautions  Continue routine exercise.  Weight loss encouraged.   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 AH Type I   Hypercholesterolemia 04/15/2013   Last Assessment & Plan:  Formatting of this note is different from the original. ASSESSMENT: Level of control to be determined by today's lab results  Lab Results  Component Value Date   CHOLTOTAL 109 06/08/2020   CHOLTOTAL 127 11/04/2019   CHOLTOTAL 97 07/11/2019   Lab Results  Component Value Date   HDL 48 06/08/2020   HDL 60 11/04/2019   HDL 42 07/11/2019   Lab Results  Component Value Date   L   Impaired fasting glucose 04/15/2013   Lipoma 04/15/2013   Melanocytic nevus 07/30/2017   Muscle weakness (generalized) 04/15/2013   Myopia of both eyes 07/30/2017   Neoplasm of uncertain behavior of skin 04/15/2013   Neoplasm of uncertain behavior of skin 04/15/2013   Nephrolithiasis 10/03/2018   Nephrolithiasis 10/03/2018   Nicotine dependence 07/30/2017   Obesity 04/15/2013   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   Last Assessment & Plan:  Formatting of this note is different from the original. ASSESSMENT: improved & he's made adjustments   PLAN: Patient referred to and seen by Dr. Astrid Divine of [facility] on 11/13/2019.  They discussed the history symptoms and radiograph findings.  They address 3 levels of treatment.  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 Hemorrhagic, no RD or tears  Incomplete posterior vitreous separation, OS  Last Assessment & Plan:  Formatting of this note might be  different from the original. Relevant Hx: Course: Daily Update: Today's Plan:   Rash and other nonspecific skin eruption 04/15/2013   Renal function test abnormal 04/15/2013   Sebaceous hyperplasia 11/12/2019   Formatting of this note might be different from the original. Seen at [facility] dermatology by PA Janese Banks for full skin exam on 11/20/2018.  Located on the face.  These are benign and enlarged oil glands within the skin.   Sebaceous hyperplasia 11/12/2019   Formatting of this note might be different from the original. Seen at [facility] dermatology by PA Janese Banks for full skin exam on 11/20/2018.  Located on the face.  These are benign and enlarged oil glands within the skin.   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 Seen at [facility] dermatology by PA Janese Banks for full skin exam on 11/20/2018.  Located on the trunk.  Next counseled that this is a yeast infection.  Reported the rash is not bothersome and opted for no treatment.   Urticaria 04/15/2013   Verruca plantaris 11/12/2019   Formatting of this note might be different from the original. Seen at [facility] dermatology by PA Janese Banks for full skin exam on 11/20/2018.  Located on the right lateral plantar midfoot.  Generally care on the palms and soles and are caused by viral infections.  The spread through direct contact.  Gust that there are treatment options and patient declined treatment that day.    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 11/12/2018   Melanocytic nevus 07/30/2017   Nicotine dependence 07/30/2017   Presbyopia 03/28/2016   Abdominal hernia 04/15/2013   Benign essential hypertension 04/15/2013   Depression 67/08/4579   Dysmetabolic syndrome X 99/83/3825   Gout, unspecified 04/15/2013   Obesity 04/15/2013   Hypercholesterolemia 04/15/2013   Pain in joints 04/15/2013   Panic disorder without agoraphobia 04/15/2013   Spasm of muscle 04/15/2013   Cataract 03/28/2012   PVD (posterior vitreous detachment), right eye 02/27/2012    Past Surgical History:  Procedure Laterality Date   COLONOSCOPY WITH PROPOFOL N/A 10/28/2021   Procedure: COLONOSCOPY WITH PROPOFOL;  Surgeon: [name], MD;  Location: [facility] ENDOSCOPY;  Service: Gastroenterology;  Laterality: N/A;   EXTRACORPOREAL SHOCK WAVE LITHOTRIPSY Left 09/23/2021   Procedure: EXTRACORPOREAL SHOCK WAVE LITHOTRIPSY (ESWL);  Surgeon: Abbie Sons, MD;  Location: [facility] ORS;  Service: Urology;  Laterality: Left;   KNEE SURGERY     LIPOMA EXCISION Right    RETINAL TEAR REPAIR CRYOTHERAPY     VASECTOMY         Home Medications    Prior to Admission medications   Medication Sig Start Date End Date Taking? Authorizing Provider  amLODipine (NORVASC) 10 MG tablet Take 10 mg by mouth daily. 07/08/21  Yes [provider]  aspirin 81 MG EC tablet Take by mouth. 08/26/04  Yes [provider]  atorvastatin (LIPITOR) 40 MG tablet Take 40 mg by mouth daily. 03/28/21  Yes [provider]  enalapril (VASOTEC) 20 MG tablet Take 20 mg by mouth daily. 09/03/21  Yes [provider]  gabapentin (NEURONTIN) 300 MG capsule gabapentin 300 mg capsule 03/26/06  Yes [provider]  diclofenac Sodium (VOLTAREN) 1 % GEL Apply topically.    [provider]  febuxostat (ULORIC) 40 MG tablet febuxostat 40 mg  tablet  TAKE 1 TABLET (40 MG TOTAL) BY MOUTH ONCE DAILY FOR GOUT FLARE PREVENTION 08/26/10   [provider]  nicotine polacrilex (NICORETTE) 2 MG gum Place inside cheek.    [provider]  tiZANidine (ZANAFLEX) 4 MG tablet Take 1 tablet (4 mg total) by mouth every 8 (eight) hours as needed for muscle spasms. 05/09/22   Carollee Leitz, MD    Family History Family History  Problem Relation Age of Onset   Heart failure Mother    Throat cancer Father  Heart disease Brother     Social History Social History   Tobacco Use   Smoking status: Never   Smokeless tobacco: Never  Vaping Use   Vaping Use: Never used  Substance Use Topics   Alcohol use: Not Currently   Drug use: Never     Allergies   Banana and Latex   Review of Systems Review of Systems : :negative unless otherwise stated in HPI.      Physical Exam Triage Vital Signs ED Triage Vitals  Enc Vitals Group     BP 05/22/22 1505 (!) 145/89     Pulse Rate 05/22/22 1505 84     Resp 05/22/22 1505 14     Temp 05/22/22 1505 98.4 F (36.9 C)     Temp Source 05/22/22 1505 Oral     SpO2 05/22/22 1505 99 %     Weight 05/22/22 1502 207 lb 8 oz (94.1 kg)     Height 05/22/22 1502 '5\' 9"'$  (1.753 m)     Head Circumference --      Peak Flow --      Pain Score 05/22/22 1501 5     Pain Loc --      Pain Edu? --      Excl. in [place]? --    No data found.  Updated Vital Signs BP (!) 145/89 (BP Location: Left Arm)   Pulse 84   Temp 98.4 F (36.9 C) (Oral)   Resp 14   Ht '5\' 9"'$  (1.753 m)   Wt 94.1 kg   SpO2 99%   BMI 30.64 kg/m   Visual Acuity Right Eye Distance:   Left Eye Distance:   Bilateral Distance:    Right Eye Near:   Left Eye Near:    Bilateral Near:     Physical Exam  GEN: pleasant well appearing male, in no acute distress  CV: regular rate and rhythm RESP: no increased work of breathing, clear to ascultation bilaterally ABD: Bowel sounds present in all quadrants. Very soft, non-tender,  moderately distended. No guarding, no rebound, no appreciable hepatosplenomegaly, no CVA tenderness, negative McBurney's, negative Murphy MSK: normal gait, normal ROM SKIN: warm, dry, no rash on visible skin NEURO: alert, oriented, moves all extremities appropriately PSYCH: Normal affect, appropriate speech and behavior   UC Treatments / Results  Labs (all labs ordered are listed, but only abnormal results are displayed) Labs Reviewed  COMPREHENSIVE METABOLIC PANEL - Abnormal; Notable for the following components:      Result Value   Creatinine, Ser 1.38 (*)    Calcium 10.5 (*)    ALT 104 (*)    GFR, Estimated 55 (*)    All other components within normal limits  CBC WITH DIFFERENTIAL/PLATELET  LIPASE, BLOOD    EKG   Radiology DG Abd 2 Views  Result Date: 05/22/2022 CLINICAL DATA:  Bloating with abdominal pain. EXAM: ABDOMEN - 2 VIEW COMPARISON:  Abdominal x-ray 09/23/2021 FINDINGS: There are dilated air-filled small bowel loops in the left upper quadrant measuring up to 4.4 cm. Air seen throughout nondilated colon to the level of the rectum. There are no suspicious calcifications. No acute fractures are seen. IMPRESSION: 1. Dilated small bowel in the left upper quadrant concerning for small bowel obstruction or ileus. Consider further evaluation with CT. Electronically Signed   By: Ronney Asters M.D.   On: 05/22/2022 16:10    Procedures Procedures (including critical care time)  Medications Ordered in UC Medications - No data to display  Initial  Impression / Assessment and Plan / UC Course  I have reviewed the triage vital signs and the nursing notes.  Pertinent labs & imaging results that were available during my care of the patient were reviewed by me and considered in my medical decision making (see chart for details).       Patient is a  71 y.o. malewith history abdominal hernia no repair, nephrolithiasis, hypertension who presents after having insidious abdominal pain  and bloating for about a week.  CBC without leukocytosis.  Has a mild hypercalcemia, calcium 10.5.  His ALT is 104.  Serum creatinine is near baseline.  Lipase is normal.  He reports no history of abdominal surgery.  KUB obtained and reviewed by me.  The radiologist questions a acute small bowel obstruction or ileus in the left upper quadrant.  Fortunately, we do not have CT available here today.  Advised patient to follow-up in the emergency department.  He is agreeable.  Spoke with the charge nurse at [facility] who will expect the patient.   Discussed MDM, treatment plan and plan for follow-up with patient/parent who agrees with plan.    Final Clinical Impressions(s) / UC Diagnoses   Final diagnoses:  SBO (small bowel obstruction) ([place])     Discharge Instructions      You have been advised to follow up immediately in the emergency department for concerning signs or symptoms as discussed during your visit. If you declined EMS transport, please have a family member take you directly to the ED at this time. Do not delay.   Based on concerns about condition, if you do not follow up in the ED, you may risk poor outcomes including worsening of condition, delayed treatment and potentially life threatening issues. If you have declined to go to the ED at this time, you should call your PCP immediately to set up a follow up appointment.       ED Prescriptions   None    PDMP not reviewed this encounter.   Lyndee Hensen, DO 05/22/22 1640    Lyndee Hensen, DO 05/22/22 1641

## 2022-05-22 NOTE — ED Triage Notes (Signed)
Patient reports bouts of constipation and diarrhea since Monday.  Patient reports abdominal pain and bloating that has gotten worse.  Patient denies nausea or vomiting.  Patient denies fevers.

## 2022-05-23 ENCOUNTER — Emergency Department
Admission: EM | Admit: 2022-05-23 | Discharge: 2022-05-23 | Disposition: A | Discharge status: Home / Self Care | Payer: Medicare PPO | Attending: Emergency Medicine | Admitting: Emergency Medicine

## 2022-05-23 DIAGNOSIS — K8020 Calculus of gallbladder without cholecystitis without obstruction: Secondary | ICD-10-CM

## 2022-05-23 DIAGNOSIS — K529 Noninfective gastroenteritis and colitis, unspecified: Secondary | ICD-10-CM

## 2022-05-23 NOTE — Discharge Instructions (Signed)
As we discussed, your CT scan demonstrated signs of inflammation around parts of your intestines and colon, this is often caused by a viral infection.  A stomach bug.  Should get better with time.  Return to the ED with any worsening symptoms.

## 2022-05-23 NOTE — ED Provider Notes (Signed)
[facility] Provider Note    Event Date/Time   First MD Initiated Contact with Patient 05/23/22 0136     (approximate)   History   Abdominal Pain   HPI  Lambros Cerro is a 71 y.o. male who presents to the ED for evaluation of Abdominal Pain   I review urgent care visit from nearly 12 hours ago where patient was evaluated for abdominal pain, bloating and stool changes.  I review KUB which demonstrates possible SBO due to dilated loops of bowel.  He was sent to the ED for CT scan.   Patient reports [place] [employer] [place], and returning 7 days ago.  Reports developing abdominal bloating, postprandial bloating, an episode of nausea and 1 episode of diarrhea.  Diarrhea resolved with a single dose of Imodium.  No episodes of emesis, abdominal pain, fever, dysuria.  Reports his stool has formed up some and normalized and denies any hematochezia or melena.  I interpret blood work from this urgent care visit with no leukocytosis or hematologic derangements, metabolic panel with CKD around baseline.  ALT 104, normal alkaline phosphatase and bilirubin.  Normal lipase.  Physical Exam   Triage Vital Signs: ED Triage Vitals  Enc Vitals Group     BP 05/22/22 1908 (!) 145/85     Pulse Rate 05/22/22 1908 81     Resp 05/22/22 1908 14     Temp 05/22/22 1908 98 F (36.7 C)     Temp Source 05/22/22 1908 Oral     SpO2 05/22/22 1908 100 %     Weight 05/22/22 1909 207 lb 3.7 oz (94 kg)     Height 05/22/22 1909 '5\' 9"'$  (1.753 m)     Head Circumference --      Peak Flow --      Pain Score 05/22/22 1909 0     Pain Loc --      Pain Edu? --      Excl. in [place]? --     Most recent vital signs: Vitals:   05/22/22 1908 05/23/22 0041  BP: (!) 145/85 123/73  Pulse: 81 (!) 57  Resp: 14 18  Temp: 98 F (36.7 C) 98.3 F (36.8 C)  SpO2: 100% 96%    General: Awake, no distress.  Sitting upright and looks well.  Pleasant and conversational. CV:  Good  peripheral perfusion.  Resp:  Normal effort.  Abd:  No distention.  Soft and benign throughout MSK:  No deformity noted.  Neuro:  No focal deficits appreciated. Other:     ED Results / Procedures / Treatments   Labs (all labs ordered are listed, but only abnormal results are displayed) Labs Reviewed - No data to display  EKG   RADIOLOGY CT scan of the abdomen interpreted by me without evidence of SBO  Official radiology report(s): CT ABDOMEN PELVIS W CONTRAST  Result Date: 05/22/2022 CLINICAL DATA:  71 year old male with acute abdominal and pelvic pain and distension. EXAM: CT ABDOMEN AND PELVIS WITH CONTRAST TECHNIQUE: Multidetector CT imaging of the abdomen and pelvis was performed using the standard protocol following bolus administration of intravenous contrast. RADIATION DOSE REDUCTION: This exam was performed according to the departmental dose-optimization program which includes automated exposure control, adjustment of the mA and/or kV according to patient size and/or use of iterative reconstruction technique. CONTRAST:  173m OMNIPAQUE IOHEXOL 300 MG/ML  SOLN COMPARISON:  05/22/2022 radiographs and 09/06/2021 CT FINDINGS: Lower chest: No acute abnormality. Hepatobiliary:  Multiple hepatic cysts are again identified without new [place] abnormality. Cholelithiasis noted without CT evidence of acute cholecystitis. There is no evidence of intrahepatic or extrahepatic biliary dilatation. Pancreas: Unremarkable Spleen: Unremarkable Adrenals/Urinary Tract: Kidneys, adrenal glands and bladder are unremarkable except for small bilateral renal calculi. There is no evidence of hydronephrosis or obstructing urinary calculi. Stomach/Bowel: A few mildly distended fluid and gas-filled loops of small bowel with central and left abdomen exhibit mild circumferential wall thickening likely representing an enteritis. There is no evidence of discrete transition point or bowel obstruction. No other bowel  wall thickening or inflammatory changes noted. The appendix is unremarkable. Colonic diverticulosis identified without evidence of acute diverticulitis. Vascular/Lymphatic: Aortic atherosclerosis. No enlarged abdominal or pelvic lymph nodes. Reproductive: Prostate is unremarkable. Other: No ascites, focal collection or pneumoperitoneum. A small LEFT inguinal hernia containing fat is noted. Musculoskeletal: No acute or suspicious bony abnormalities are noted. IMPRESSION: 1. Mildly distended fluid and gas-filled loops of small bowel with mild circumferential wall thickening likely representing an enteritis. No evidence of bowel obstruction at this time. No evidence of pneumoperitoneum. 2. Cholelithiasis without CT evidence of acute cholecystitis. 3. Bilateral nephrolithiasis. 4. Small LEFT inguinal hernia containing fat. 5. Aortic Atherosclerosis (ICD10-I70.0). Electronically Signed   By: Margarette Canada M.D.   On: 05/22/2022 20:26   DG Abd 2 Views  Result Date: 05/22/2022 CLINICAL DATA:  Bloating with abdominal pain. EXAM: ABDOMEN - 2 VIEW COMPARISON:  Abdominal x-ray 09/23/2021 FINDINGS: There are dilated air-filled small bowel loops in the left upper quadrant measuring up to 4.4 cm. Air seen throughout nondilated colon to the level of the rectum. There are no suspicious calcifications. No acute fractures are seen. IMPRESSION: 1. Dilated small bowel in the left upper quadrant concerning for small bowel obstruction or ileus. Consider further evaluation with CT. Electronically Signed   By: Ronney Asters M.D.   On: 05/22/2022 16:10    PROCEDURES and INTERVENTIONS:  Procedures  Medications  iohexol (OMNIPAQUE) 300 MG/ML solution 100 mL (100 mLs Intravenous Contrast Given 05/22/22 1956)     IMPRESSION / MDM / ASSESSMENT AND PLAN / ED COURSE  I reviewed the triage vital signs and the nursing notes.  Differential diagnosis includes, but is not limited to, Crohn's disease, ulcerative colitis, gastroenteritis,  SBO, appendicitis, dehydration  {Patient presents with symptoms of an acute illness or injury that is potentially life-threatening.  Pleasant and quite healthy 71 year old male presents to the ED with a week of resolving abdominal bloating and stool changes, possibly a gastroenteritis or viral syndrome, ultimately suitable for outpatient management.  CT scan without evidence of SBO.  Does demonstrate stigmata of enteritis which is likely the etiology of his symptoms.  Cholelithiasis is noted without signs of cholecystitis.  His symptoms are not particularly consistent with biliary colic.  We discussed natural progression of this what to look out for at home with regards to hepatobiliary pathology.  He is asymptomatic here in the ED and suitable for outpatient management.  We discussed return precautions.      FINAL CLINICAL IMPRESSION(S) / ED DIAGNOSES   Final diagnoses:  Enteritis  Calculus of gallbladder without cholecystitis without obstruction     Rx / DC Orders   ED Discharge Orders     None        Note:  This document was prepared using Dragon voice recognition software and may include unintentional dictation errors.   Vladimir Crofts, MD 05/23/22 [phone]

## 2022-05-31 ENCOUNTER — Ambulatory Visit: Payer: Medicare PPO

## 2022-05-31 ENCOUNTER — Telehealth: Payer: Self-pay

## 2022-05-31 NOTE — Telephone Encounter (Signed)
No answer when called for AWV. System down, late connecting. Left message for reschedule.

## 2022-07-08 ENCOUNTER — Ambulatory Visit
Admission: EM | Admit: 2022-07-08 | Discharge: 2022-07-08 | Disposition: A | Discharge status: Home / Self Care | Payer: Medicare PPO | Attending: Physician Assistant | Admitting: Physician Assistant

## 2022-07-08 DIAGNOSIS — N200 Calculus of kidney: Secondary | ICD-10-CM | POA: Insufficient documentation

## 2022-07-08 LAB — URINALYSIS, ROUTINE W REFLEX MICROSCOPIC
Bilirubin Urine: NEGATIVE
Glucose, UA: NEGATIVE mg/dL
Leukocytes,Ua: NEGATIVE
Nitrite: NEGATIVE
Protein, ur: 100 mg/dL — AB
Specific Gravity, Urine: 1.025 (ref 1.005–1.030)
pH: 6 (ref 5.0–8.0)

## 2022-07-08 LAB — URINALYSIS, MICROSCOPIC (REFLEX): RBC / HPF: >50 RBC/hpf (ref 0–5)

## 2022-07-08 MED ORDER — TAMSULOSIN HCL 0.4 MG PO CAPS: 0.4000 mg | ORAL_CAPSULE | Freq: Every day | ORAL | 0 refills | Status: DC

## 2022-07-08 NOTE — ED Triage Notes (Signed)
Patient reports that he has had kidney stones in the paid.   Patient reports that 2 days ago he started having pain with urination. Patient reports this morning he started having bad flank pain and Abdominal pain.   Patient reports he did take on Oxycodone and Zofran with no help.

## 2022-07-08 NOTE — Discharge Instructions (Addendum)
Today you are being treated prophlyactically for a kidney stone. In the urgent care setting, I do not have definitive imaging to determine if a kidney stone is present. A x-ray will only show large stones meaning a stone could be present but it will not be seen. This decision was made based on your symptoms and urinalysis.  Your urinalysis is negative for infection  Take Tamsulosin on every day before bed for 14 days. This medication relaxes the urethra and will allow the stone to move through easier   Schedule appointment with urologist for 2 weeks for reevaluation   At any point if your pain becomes more severe, urine in blood begins or increases, you become lightheaded or dizzy please go to the nearest emergency department for further evaluation

## 2022-07-08 NOTE — ED Provider Notes (Signed)
[facility]    CSN: [id number] Arrival date & time: 07/08/22  1653      History   Chief Complaint Chief Complaint  Patient presents with   Dysuria    HPI Charles Waters is a 71 y.o. male.   Patient presents with dysuria and flank pain.  Dysuria began 2 days ago and improved after an increase with fluid intake but restarted today.  Began to have right-sided flank pain radiating to the right groin/abdomen this morning.  Pain became severe eliciting nausea without vomiting.  Has attempted use of Zofran and oxycodone, last dose 20 minutes ago.  Has slowly started to ease symptoms.  History of reoccurring kidney stones.  Denies hematuria, frequency, lower abdominal pain or pressure, fever, chills.  Denies sexual activity.   Past Medical History:  Diagnosis Date   Abdominal hernia 04/15/2013   Abnormal weight gain 04/15/2013   Abnormal weight gain 04/15/2013   Actinic keratoses 11/12/2019   Formatting of this note might be different from the original. Seen at [facility] dermatology by PA Janese Banks for full skin exam on 11/20/2018.  Distributed on the face.  These were precancerous proliferations that can occur with sun damaged skin.  Also subset of AK's can develop into squamous cell carcinoma.  She discussed treatment with cryotherapy versus 5-fluorouracil.  Recommended field tr   Anxiety state 04/15/2013   Anxiety state 04/15/2013   Benign essential hypertension 04/15/2013   Last Assessment & Plan:  Formatting of this note might be different from the original. ASSESSMENT: Blood pressure is well controlled here today.  Home BP readings range: no concerning readings   PLAN:  Medications: continue Amlodipine, Enalapril. -  Labs as per orders if indicated/due Check & log BP and bring readings to f/u and machine.  Low sodium/DASH diet is advised. Consistent aerobic exercis   Cataract 03/28/2012   Contact dermatitis and other eczema, due to unspecified cause 04/15/2013   Contact  dermatitis and other eczema, due to unspecified cause 04/15/2013   Depression 03/50/0938   Dysmetabolic syndrome X 18/29/9371   Family history of congestive heart failure 07/30/2017   Formatting of this note might be different from the original. 03/28/2016:  Mother & identical twin brother both with CHF. Identical twin much self inflicted-alcoholism, smoking, drugs, etc.  We discuss utility of BNP testing and/or cardiac consult. I advise the latter for full evaluation as Cable has RFs certainly with HTN & HL, Lesia Monica male of 64yoa. He is obese by BMI despite his routine CV exercise    Gout    Gout, unspecified 04/15/2013   Last Assessment & Plan:  Formatting of this note might be different from the original. ASSESSMENT: stable without acute flares   PLAN: Medications: continue Uloric '40mg'$  generic  Dietary precautions  Continue routine exercise.  Weight loss encouraged.   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 AH Type I   Hypercholesterolemia 04/15/2013   Last Assessment & Plan:  Formatting of this note is different from the original. ASSESSMENT: Level of control to be determined by today's lab results  Lab Results  Component Value Date   CHOLTOTAL 109 06/08/2020   CHOLTOTAL 127 11/04/2019   CHOLTOTAL 97 07/11/2019   Lab Results  Component Value Date   HDL 48 06/08/2020   HDL 60 11/04/2019   HDL 42 07/11/2019   Lab Results  Component Value Date   L   Impaired  fasting glucose 04/15/2013   Lipoma 04/15/2013   Melanocytic nevus 07/30/2017   Muscle weakness (generalized) 04/15/2013   Myopia of both eyes 07/30/2017   Neoplasm of uncertain behavior of skin 04/15/2013   Neoplasm of uncertain behavior of skin 04/15/2013   Nephrolithiasis 10/03/2018   Nephrolithiasis 10/03/2018   Nicotine dependence 07/30/2017   Obesity 04/15/2013   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   Last Assessment & Plan:  Formatting of this note is different from the original. ASSESSMENT: improved & he's made adjustments   PLAN: Patient referred to and seen by Dr. Astrid Divine of [facility] on 11/13/2019.  They discussed the history symptoms and radiograph findings.  They address 3 levels of treatment.  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 Hemorrhagic, no RD or tears  Incomplete posterior vitreous separation, OS  Last Assessment & Plan:  Formatting of this note might be different from the original. Relevant Hx: Course: Daily Update: Today's Plan:   Rash and other nonspecific skin eruption 04/15/2013   Renal function test abnormal 04/15/2013   Sebaceous hyperplasia 11/12/2019   Formatting of this note might be different from the original. Seen at [facility] dermatology by PA Janese Banks for full skin exam on 11/20/2018.  Located on the face.  These are benign and enlarged oil glands within the skin.   Sebaceous hyperplasia 11/12/2019   Formatting of this note might be different from the original. Seen at [facility] dermatology by PA Janese Banks for full skin exam on 11/20/2018.  Located on the face.  These are benign and enlarged oil glands within the skin.   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 Seen at [facility] dermatology by PA Janese Banks for full skin exam on 11/20/2018.  Located on the trunk.  Next  counseled that this is a yeast infection.  Reported the rash is not bothersome and opted for no treatment.   Urticaria 04/15/2013   Verruca plantaris 11/12/2019   Formatting of this note might be different from the original. Seen at [facility] dermatology by PA Janese Banks for full skin exam on 11/20/2018.  Located on the right lateral plantar midfoot.  Generally care on the palms and soles and are caused by viral infections.  The spread through direct contact.  Gust that there are treatment options and patient declined treatment that day.    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 11/12/2018   Melanocytic nevus 07/30/2017   Nicotine dependence 07/30/2017   Presbyopia 03/28/2016   Abdominal hernia 04/15/2013   Benign essential hypertension 04/15/2013   Depression 95/05/3266   Dysmetabolic syndrome X 12/45/8099  Gout, unspecified 04/15/2013   Obesity 04/15/2013   Hypercholesterolemia 04/15/2013   Pain in joints 04/15/2013   Panic disorder without agoraphobia 04/15/2013   Spasm of muscle 04/15/2013   Cataract 03/28/2012   PVD (posterior vitreous detachment), right eye 02/27/2012    Past Surgical History:  Procedure Laterality Date   COLONOSCOPY WITH PROPOFOL N/A 10/28/2021   Procedure: COLONOSCOPY WITH PROPOFOL;  Surgeon: [name], MD;  Location: [facility] ENDOSCOPY;  Service: Gastroenterology;  Laterality: N/A;   EXTRACORPOREAL SHOCK WAVE LITHOTRIPSY Left 09/23/2021   Procedure: EXTRACORPOREAL SHOCK WAVE LITHOTRIPSY (ESWL);  Surgeon: Abbie Sons, MD;  Location: [facility] ORS;  Service: Urology;  Laterality: Left;   KNEE SURGERY     LIPOMA EXCISION Right    RETINAL TEAR REPAIR  CRYOTHERAPY     VASECTOMY         Home Medications    Prior to Admission medications   Medication Sig Start Date End Date Taking? Authorizing Provider  amLODipine (NORVASC) 10 MG tablet Take 10 mg by mouth daily. 07/08/21   [provider]  aspirin 81 MG EC tablet Take by mouth. 08/26/04   [provider]  atorvastatin (LIPITOR) 40 MG tablet Take 40 mg by mouth daily. 03/28/21   [provider]  diclofenac Sodium (VOLTAREN) 1 % GEL Apply 2 g topically 4 (four) times daily as needed.    [provider]  enalapril (VASOTEC) 20 MG tablet Take 20 mg by mouth daily. 09/03/21   [provider]  febuxostat (ULORIC) 40 MG tablet febuxostat 40 mg tablet  TAKE 1 TABLET (40 MG TOTAL) BY MOUTH ONCE DAILY FOR GOUT FLARE PREVENTION 08/26/10   [provider]  gabapentin (NEURONTIN) 300 MG capsule Take 300 mg by mouth daily as needed. 03/26/06   [provider]  nicotine polacrilex (NICORETTE) 2 MG gum Place inside cheek.    [provider]  omeprazole (PRILOSEC) 20 MG capsule Take 20 mg by mouth daily.    [provider]  simethicone (MYLICON) 80 MG chewable tablet Chew 80 mg by mouth every 6 (six) hours as needed for flatulence.    [provider]  tiZANidine (ZANAFLEX) 4 MG tablet Take 1 tablet (4 mg total) by mouth every 8 (eight) hours as needed for muscle spasms. 05/09/22   Carollee Leitz, MD    Family History Family History  Problem Relation Age of Onset   Heart failure Mother    Throat cancer Father    Heart disease Brother     Social History Social History   Tobacco Use   Smoking status: Never   Smokeless tobacco: Never  Vaping Use   Vaping Use: Never used  Substance Use Topics   Alcohol use: Not Currently   Drug use: Never     Allergies   Banana and Latex   Review of Systems Review of Systems  Constitutional: Negative.   Respiratory: Negative.    Cardiovascular: Negative.    Genitourinary:  Positive for dysuria, flank pain and urgency. Negative for decreased urine volume, difficulty urinating, enuresis, frequency, genital sores, hematuria, penile discharge, penile pain, penile swelling, scrotal swelling and testicular pain.  Skin: Negative.   Neurological: Negative.      Physical Exam Triage Vital Signs ED Triage Vitals  Enc Vitals Group     BP 07/08/22 1702 131/70     Pulse Rate 07/08/22 1702 (!) 57     Resp --      Temp 07/08/22 1702 98.1 F (36.7 C)  Temp Source 07/08/22 1702 Oral     SpO2 07/08/22 1702 99 %     Weight 07/08/22 1700 205 lb (93 kg)     Height 07/08/22 1700 '5\' 9"'$  (1.753 m)     Head Circumference --      Peak Flow --      Pain Score 07/08/22 1700 8     Pain Loc --      Pain Edu? --      Excl. in [place]? --    No data found.  Updated Vital Signs BP 131/70 (BP Location: Left Arm)   Pulse (!) 57   Temp 98.1 F (36.7 C) (Oral)   Ht '5\' 9"'$  (1.753 m)   Wt 205 lb (93 kg)   SpO2 99%   BMI 30.27 kg/m   Visual Acuity Right Eye Distance:   Left Eye Distance:   Bilateral Distance:    Right Eye Near:   Left Eye Near:    Bilateral Near:     Physical Exam Constitutional:      Appearance: Normal appearance.  HENT:     Head: Normocephalic.  Eyes:     Extraocular Movements: Extraocular movements intact.  Pulmonary:     Effort: Pulmonary effort is normal.  Abdominal:     General: Abdomen is flat. Bowel sounds are normal.     Palpations: Abdomen is soft.     Tenderness: There is abdominal tenderness in the suprapubic area. There is right CVA tenderness. There is no left CVA tenderness.  Skin:    General: Skin is warm and dry.  Neurological:     Mental Status: He is alert and oriented to person, place, and time. Mental status is at baseline.  Psychiatric:        Mood and Affect: Mood normal.        Behavior: Behavior normal.      UC Treatments / Results  Labs (all labs ordered are listed, but only abnormal results are  displayed) Labs Reviewed  URINALYSIS, [place] MICROSCOPIC    EKG   Radiology No results found.  Procedures Procedures (including critical care time)  Medications Ordered in UC Medications - No data to display  Initial Impression / Assessment and Plan / UC Course  I have reviewed the triage vital signs and the nursing notes.  Pertinent labs & imaging results that were available during my care of the patient were reviewed by me and considered in my medical decision making (see chart for details).  Kidney stone  Urinalysis negative, discussed with patient, symptomology and presentation consistent with a kidney stone, CT unavailable definitive diagnostics however based on history we will prophylactically treat, prescribed Flomax, endorses that he has not oxycodone and Zofran at home for symptomatic management, advised to strain urine and to make follow-up appointment with urologist for 1 to 2 weeks, given strict precautions for symptoms worsening in severity to go to the nearest emergency department for rule out obstruction Final Clinical Impressions(s) / UC Diagnoses   Final diagnoses:  None   Discharge Instructions   None    ED Prescriptions   None    PDMP not reviewed this encounter.   Hans Eden, NP 07/08/22 1747

## 2022-07-11 ENCOUNTER — Encounter: Payer: Self-pay | Admitting: Family Medicine

## 2022-07-11 ENCOUNTER — Ambulatory Visit: Payer: Medicare PPO | Admitting: Family Medicine

## 2022-07-11 VITALS — BP 118/72 | HR 77 | Temp 97.5°F | Ht 69.0 in | Wt 210.2 lb

## 2022-07-11 DIAGNOSIS — R7401 Elevation of levels of liver transaminase levels: Secondary | ICD-10-CM

## 2022-07-11 DIAGNOSIS — R3129 Other microscopic hematuria: Secondary | ICD-10-CM | POA: Diagnosis not present

## 2022-07-11 DIAGNOSIS — R7989 Other specified abnormal findings of blood chemistry: Secondary | ICD-10-CM

## 2022-07-11 DIAGNOSIS — N200 Calculus of kidney: Secondary | ICD-10-CM

## 2022-07-11 DIAGNOSIS — I10 Essential (primary) hypertension: Secondary | ICD-10-CM | POA: Diagnosis not present

## 2022-07-11 NOTE — Progress Notes (Signed)
    SUBJECTIVE:   CHIEF COMPLAINT / HPI: follow up HTN  Presents for follow up for blood pressure. Seen in clinic on 08/14 to establish care.  No changes were made to antihypertensives at that time.  Potassium citrate and potassium chloride was discontinued.   Since then patient reports BP has been less that 120/80, except for a recent visit to Urgent care for Kidney stones. Denies any weakness, dizziness, chest pain, shortness of breath or lower extremity swelling.  Kidney stones Feeling much improved.  Denies any fevers, abdominal pain or back pain.  No hematuria.  Has not started Flomax as was prescribed in Urgent care.  Reviewed recent CT abd from 04/2022 with patient.  Was noted to have bilateral kidney stones.  Patient has been followed by urology in past.  PERTINENT  PMH / [place]:  HTN HLD   OBJECTIVE:   BP 118/72 (BP Location: Left Arm, Patient Position: Sitting, Cuff Size: Large)   Pulse 77   Temp (!) 97.5 F (36.4 C) (Oral)   Ht $R'5\' 9"'zc$  (1.753 m)   Wt 210 lb 3.2 oz (95.3 kg)   SpO2 99%   BMI 31.04 kg/m    General: Alert, no acute distress Cardio: Normal S1 and S2, RRR, no r/m/g Pulm: CTAB, normal work of breathing Abdomen: Bowel sounds normal. Abdomen soft and non-tender.  Extremities: No peripheral edema.  Neuro: Cranial nerves grossly intact   ASSESSMENT/PLAN:   Benign essential hypertension Well controlled Continue Amlodipine 10 mg daily Continue Vasotec 20 mg daily CMet today Follow up in 1 month  Elevated LFTs Asymptomatic.  Reviewed recent labs, significant for elevated ALT/AST and Alk Phos during hospitalization for viral gastroenteritis.   -Repeat LFT's today -Consider imaging if remains elevated  Nephrolithiasis Recently seen in ED for nephrolithiases. Started on Flomax.  Has not picked up medication yet.  Now feeling much better today.  Reviewed recent urine studies.  Neg for Leuks and Nitrates. Positive for large amount blood and trace ketones.   Proteinuria present.  Recent CT abd from 04/2022 reviewed showing bilateral kidney stones. -Repeat urine today -Send urine culture -Start Flomax -Recommend that patient schedule appointment with urology for follow up.       Carollee Leitz, MD

## 2022-07-11 NOTE — Patient Instructions (Addendum)
It was a pleasure meeting you today. Thank you for allowing me to take part in your health care.  Our goals for today as we discussed include:  For your kidney stone Your previous CT abdomen in August showed kidney stones in the right and left kidneys. Will repeat your urine today  Continue Flomax 0.4 mg daily until you see urology Call your Urologist to schedule an appointment   For your elevated liver function test Will repeat this today    Please follow-up with PCP in 1 months  If you have any questions or concerns, please do not hesitate to call the office at [phone].  I look forward to our next visit and until then take care and stay safe.  Regards,   Carollee Leitz, MD   [facility]   Dietary Guidelines to Help Prevent Kidney Stones Kidney stones are deposits of minerals and salts that form inside your kidneys. Your risk of developing kidney stones may be greater depending on your diet, your lifestyle, the medicines you take, and whether you have certain medical conditions. Most people can lower their risks of developing kidney stones by following these dietary guidelines. Your dietitian may give you more specific instructions depending on your overall health and the type of kidney stones you tend to develop. What are tips for following this plan? Reading food labels  Choose foods with "no salt added" or "low-salt" labels. Limit your salt (sodium) intake to less than 1,500 mg a day. Choose foods with calcium for each meal and snack. Try to eat about 300 mg of calcium at each meal. Foods that contain 200-500 mg of calcium a serving include: 8 oz (237 mL) of milk, calcium-fortifiednon-dairy milk, and calcium-fortifiedfruit juice. Calcium-fortified means that calcium has been added to these drinks. 8 oz (237 mL) of kefir, yogurt, and soy yogurt. 4 oz (114 g) of tofu. 1 oz (28 g) of cheese. 1 cup (150 g) of dried figs. 1 cup (91 g) of cooked broccoli. One  3 oz (85 g) can of sardines or mackerel. Most people need 1,000-1,500 mg of calcium a day. Talk to your dietitian about how much calcium is recommended for you. Shopping Buy plenty of fresh fruits and vegetables. Most people do not need to avoid fruits and vegetables, even if these foods contain nutrients that may contribute to kidney stones. When shopping for convenience foods, choose: Whole pieces of fruit. Pre-made salads with dressing on the side. Low-fat fruit and yogurt smoothies. Avoid buying frozen meals or prepared deli foods. These can be high in sodium. Look for foods with live cultures, such as yogurt and kefir. Choose high-fiber grains, such as whole-wheat breads, oat bran, and wheat cereals. Cooking Do not add salt to food when cooking. Place a salt shaker on the table and allow each person to add their own salt to taste. Use vegetable protein, such as beans, textured vegetable protein (TVP), or tofu, instead of meat in pasta, casseroles, and soups. Meal planning Eat less salt, if told by your dietitian. To do this: Avoid eating processed or pre-made food. Avoid eating fast food. Eat less animal protein, including cheese, meat, poultry, or fish, if told by your dietitian. To do this: Limit the number of times you have meat, poultry, fish, or cheese each week. Eat a diet free of meat at least 2 days a week. Eat only one serving each day of meat, poultry, fish, or seafood. When you prepare animal proteins, cut pieces into small  portion sizes. For most meat and fish, one serving is about the size of the palm of your hand. Eat at least five servings of fresh fruits and vegetables each day. To do this: Keep fruits and vegetables on hand for snacks. Eat one piece of fruit or a handful of berries with breakfast. Have a salad and fruit at lunch. Have two kinds of vegetables at dinner. You may be told to limit foods that are high in a substance called oxalate. These include: Spinach  (cooked), rhubarb, beets, sweet potatoes, and Swiss chard. Peanuts. Potato chips, french fries, and baked potatoes with skin on. Nuts and nut products. Chocolate. If you regularly take a diuretic medicine, make sure to eat at least 1 or 2 servings of fruits or vegetables that are high in potassium each day. These include: Avocado. Banana. Orange, prune, carrot, or tomato juice. Baked potato. Cabbage. Beans and split peas. Lifestyle  Drink enough fluid to keep your urine pale yellow. This is the most important thing you can do. Spread your fluid intake throughout the day. If you drink alcohol: Limit how much you have to: 0-1 drink a day for women who are not pregnant. 0-2 drinks a day for men. Know how much alcohol is in your drink. In the [place] one drink equals one 12 oz bottle of beer (355 mL), one 5 oz glass of wine (148 mL), or one 1 oz glass of hard liquor (44 mL). Lose weight if told by your health care provider. Work with your dietitian to find an eating plan and weight loss strategies that work best for you. General information Talk to your health care provider and dietitian about taking daily supplements. Depending on your health and the cause of your kidney stones, you may be told: Do not take high-dose supplements of vitamin C (1,000 mg a day or more). To take a calcium supplement. To take a daily probiotic supplement. To take other supplements such as magnesium, fish oil, or vitamin B6. Take over-the-counter and prescription medicines only as told by your health care provider. These include supplements. What foods should I limit? Limit your intake of the following foods, or eat them as told by your dietitian. Vegetables Spinach. Rhubarb. Beets. Canned vegetables. Angie Fava. Olives. Baked potatoes with skin. Grains Wheat bran. Baked goods. Salted crackers. Cereals high in sugar. Meats and other proteins Nuts. Nut butters. Large portions of meat, poultry, or fish. Salted,  precooked, or cured meats, such as sausages, meat loaves, and hot dogs. Dairy Cheeses. Beverages Regular soft drinks. Regular vegetable juice. Seasonings and condiments Seasoning blends with salt. Salad dressings. Soy sauce. Ketchup. Barbecue sauce. Other foods Canned soups. Canned pasta sauce. Casseroles. Pizza. Lasagna. Frozen meals. Potato chips. [place] fries. The items listed above may not be a complete list of foods and beverages you should limit. Contact a dietitian for more information. What foods should I avoid? Talk to your dietitian about specific foods you should avoid based on the type of kidney stones you have and your overall health. Fruits Grapefruit. The item listed above may not be a complete list of foods and beverages you should avoid. Contact a dietitian for more information. Summary Kidney stones are deposits of minerals and salts that form inside your kidneys. You can lower your risk of kidney stones by making changes to your diet. The most important thing you can do is drink enough fluid. Drink enough fluid to keep your urine pale yellow. Talk to your dietitian about how much calcium  you should have each day, and eat less salt and animal protein as told by your dietitian. This information is not intended to replace advice given to you by your health care provider. Make sure you discuss any questions you have with your health care provider. Document Revised: 12/23/2021 Document Reviewed: 12/23/2021 Elsevier Patient Education  [place].

## 2022-07-12 LAB — COMPREHENSIVE METABOLIC PANEL
ALT: 29 U/L (ref 0–53)
AST: 21 U/L (ref 0–37)
Albumin: 4.4 g/dL (ref 3.5–5.2)
Alkaline Phosphatase: 72 U/L (ref 39–117)
BUN: 12 mg/dL (ref 6–23)
CO2: 30 mEq/L (ref 19–32)
Calcium: 10.9 mg/dL — ABNORMAL HIGH (ref 8.4–10.5)
Chloride: 102 mEq/L (ref 96–112)
Creatinine, Ser: 1.28 mg/dL (ref 0.40–1.50)
GFR: 56.8 mL/min — ABNORMAL LOW (ref >60.00)
Glucose, Bld: 107 mg/dL — ABNORMAL HIGH (ref 70–99)
Potassium: 4 mEq/L (ref 3.5–5.1)
Sodium: 138 mEq/L (ref 135–145)
Total Bilirubin: 0.7 mg/dL (ref 0.2–1.2)
Total Protein: 7.2 g/dL (ref 6.0–8.3)

## 2022-07-12 LAB — URINALYSIS, ROUTINE W REFLEX MICROSCOPIC
Bilirubin Urine: NEGATIVE
Ketones, ur: NEGATIVE
Leukocytes,Ua: NEGATIVE
Nitrite: NEGATIVE
Specific Gravity, Urine: 1.02 (ref 1.000–1.030)
Total Protein, Urine: 30 — AB
Urine Glucose: NEGATIVE
Urobilinogen, UA: 0.2 (ref 0.0–1.0)
pH: 6.5 (ref 5.0–8.0)

## 2022-07-12 LAB — URINE CULTURE
MICRO NUMBER:: 14055516
Result:: NO GROWTH
SPECIMEN QUALITY:: ADEQUATE

## 2022-07-14 ENCOUNTER — Telehealth: Payer: Self-pay | Admitting: Family Medicine

## 2022-07-14 ENCOUNTER — Other Ambulatory Visit: Payer: Self-pay

## 2022-07-14 DIAGNOSIS — N200 Calculus of kidney: Secondary | ICD-10-CM

## 2022-07-14 NOTE — Telephone Encounter (Signed)
Refill febuxostat (ULORIC) 40 MG tablet

## 2022-07-15 ENCOUNTER — Other Ambulatory Visit: Payer: Self-pay

## 2022-07-15 ENCOUNTER — Other Ambulatory Visit: Payer: Self-pay | Admitting: Family Medicine

## 2022-07-15 DIAGNOSIS — N200 Calculus of kidney: Secondary | ICD-10-CM

## 2022-07-15 MED ORDER — FEBUXOSTAT 40 MG PO TABS: ORAL_TABLET | ORAL | 0 refills | Status: DC

## 2022-07-15 NOTE — Telephone Encounter (Signed)
Uloric prescription sent in to pharmacy

## 2022-07-16 ENCOUNTER — Encounter: Payer: Self-pay | Admitting: Family Medicine

## 2022-07-16 DIAGNOSIS — R7989 Other specified abnormal findings of blood chemistry: Secondary | ICD-10-CM | POA: Insufficient documentation

## 2022-07-16 HISTORY — DX: Other specified abnormal findings of blood chemistry: R79.89

## 2022-07-16 NOTE — Assessment & Plan Note (Signed)
Asymptomatic.  Reviewed recent labs, significant for elevated ALT/AST and Alk Phos during hospitalization for viral gastroenteritis.   -Repeat LFT's today -Consider imaging if remains elevated

## 2022-07-16 NOTE — Assessment & Plan Note (Signed)
Recently seen in ED for nephrolithiases. Started on Flomax.  Has not picked up medication yet.  Now feeling much better today.  Reviewed recent urine studies.  Neg for Leuks and Nitrates. Positive for large amount blood and trace ketones.  Proteinuria present.  Recent CT abd from 04/2022 reviewed showing bilateral kidney stones. -Repeat urine today -Send urine culture -Start Flomax -Recommend that patient schedule appointment with urology for follow up.

## 2022-07-16 NOTE — Assessment & Plan Note (Signed)
Well controlled Continue Amlodipine 10 mg daily Continue Vasotec 20 mg daily CMet today Follow up in 1 month

## 2022-07-19 ENCOUNTER — Other Ambulatory Visit: Payer: Self-pay

## 2022-07-19 ENCOUNTER — Telehealth: Payer: Self-pay | Admitting: Family Medicine

## 2022-07-19 ENCOUNTER — Other Ambulatory Visit: Payer: Self-pay | Admitting: Family Medicine

## 2022-07-19 DIAGNOSIS — N200 Calculus of kidney: Secondary | ICD-10-CM

## 2022-07-19 MED ORDER — FEBUXOSTAT 40 MG PO TABS: ORAL_TABLET | ORAL | 0 refills | Status: DC

## 2022-07-19 NOTE — Telephone Encounter (Signed)
Patient is requesting a refill on febuxostat (ULORIC) 40 MG tablet. Pharmacy is [facility], [address]  Patient does not use [facility].

## 2022-07-19 NOTE — Telephone Encounter (Signed)
Prescription was [facility] to [facility] [address]

## 2022-07-19 NOTE — Telephone Encounter (Signed)
Prescription for Uloric sent to [place] at [facility]

## 2022-07-21 ENCOUNTER — Other Ambulatory Visit: Payer: Self-pay

## 2022-07-21 DIAGNOSIS — N200 Calculus of kidney: Secondary | ICD-10-CM

## 2022-07-21 MED ORDER — FEBUXOSTAT 40 MG PO TABS: ORAL_TABLET | ORAL | 0 refills | Status: DC

## 2022-07-21 NOTE — Telephone Encounter (Signed)
Prescription sent in today to [facility] [place].

## 2022-07-21 NOTE — Telephone Encounter (Signed)
Patient called and is still looking for a refill on his febuxostat (ULORIC) 40 MG tablet. This prescription looks like it was never sent to pharmacy.

## 2022-07-27 ENCOUNTER — Ambulatory Visit
Admission: RE | Admit: 2022-07-27 | Discharge: 2022-07-27 | Disposition: A | Discharge status: Home / Self Care | Payer: Medicare PPO | Source: Ambulatory Visit | Attending: Urology | Admitting: Urology

## 2022-07-27 ENCOUNTER — Encounter: Payer: Self-pay | Admitting: Urology

## 2022-07-27 ENCOUNTER — Ambulatory Visit: Payer: Medicare PPO | Admitting: Urology

## 2022-07-27 VITALS — BP 159/79 | HR 83 | Ht 69.0 in | Wt 212.2 lb

## 2022-07-27 DIAGNOSIS — N201 Calculus of ureter: Secondary | ICD-10-CM | POA: Insufficient documentation

## 2022-07-27 DIAGNOSIS — R310 Gross hematuria: Secondary | ICD-10-CM

## 2022-07-27 DIAGNOSIS — N200 Calculus of kidney: Secondary | ICD-10-CM | POA: Diagnosis not present

## 2022-07-27 LAB — URINALYSIS, COMPLETE
Bilirubin, UA: NEGATIVE
Glucose, UA: NEGATIVE
Ketones, UA: NEGATIVE
Leukocytes,UA: NEGATIVE
Nitrite, UA: NEGATIVE
Protein,UA: NEGATIVE
Specific Gravity, UA: 1.01 (ref 1.005–1.030)
Urobilinogen, Ur: 0.2 mg/dL (ref 0.2–1.0)
pH, UA: 7 (ref 5.0–7.5)

## 2022-07-27 LAB — MICROSCOPIC EXAMINATION

## 2022-07-27 NOTE — Progress Notes (Signed)
07/27/2022 3:20 PM   Charles Waters Oct 12, 1950 [id number]  Referring provider: Carollee Leitz, MD [place],  [place] [address]  Chief Complaint  Patient presents with   Nephrolithiasis    No symptoms today    HPI:    PMH: Past Medical History:  Diagnosis Date   Abdominal hernia 04/15/2013   Abnormal weight gain 04/15/2013   Abnormal weight gain 04/15/2013   Actinic keratoses 11/12/2019   Formatting of this note might be different from the original. Seen at [facility] dermatology by PA Janese Banks for full skin exam on 11/20/2018.  Distributed on the face.  These were precancerous proliferations that can occur with sun damaged skin.  Also subset of AK's can develop into squamous cell carcinoma.  She discussed treatment with cryotherapy versus 5-fluorouracil.  Recommended field tr   Anxiety state 04/15/2013   Anxiety state 04/15/2013   Benign essential hypertension 04/15/2013   Last Assessment & Plan:  Formatting of this note might be different from the original. ASSESSMENT: Blood pressure is well controlled here today.  Home BP readings range: no concerning readings   PLAN:  Medications: continue Amlodipine, Enalapril. -  Labs as per orders if indicated/due Check & log BP and bring readings to f/u and machine.  Low sodium/DASH diet is advised. Consistent aerobic exercis   Cataract 03/28/2012   Contact dermatitis and other eczema, due to unspecified cause 04/15/2013   Contact dermatitis and other eczema, due to unspecified cause 04/15/2013   Depression 06/18/3006   Dysmetabolic syndrome X 62/26/3335   Family history of congestive heart failure 07/30/2017   Formatting of this note might be different from the original. 03/28/2016:  Mother & identical twin brother both with CHF. Identical twin much self inflicted-alcoholism, smoking, drugs, etc.  We discuss utility of BNP testing and/or cardiac consult. I advise the latter for full evaluation as Linda has RFs certainly with HTN  & HL, white male of 64yoa. He is obese by BMI despite his routine CV exercise    Gout    Gout, unspecified 04/15/2013   Last Assessment & Plan:  Formatting of this note might be different from the original. ASSESSMENT: stable without acute flares   PLAN: Medications: continue Uloric '40mg'$  generic  Dietary precautions  Continue routine exercise.  Weight loss encouraged.   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 AH Type I   Hypercholesterolemia 04/15/2013   Last Assessment & Plan:  Formatting of this note is different from the original. ASSESSMENT: Level of control to be determined by today's lab results  Lab Results  Component Value Date   CHOLTOTAL 109 06/08/2020   CHOLTOTAL 127 11/04/2019   CHOLTOTAL 97 07/11/2019   Lab Results  Component Value Date   HDL 48 06/08/2020   HDL 60 11/04/2019   HDL 42 07/11/2019   Lab Results  Component Value Date   L   Impaired fasting glucose 04/15/2013   Lipoma 04/15/2013   Melanocytic nevus 07/30/2017   Muscle weakness (generalized) 04/15/2013   Myopia of both eyes 07/30/2017   Neoplasm of uncertain behavior of skin 04/15/2013   Neoplasm of uncertain behavior of skin 04/15/2013   Nephrolithiasis 10/03/2018   Nephrolithiasis 10/03/2018   Nicotine dependence 07/30/2017   Obesity 04/15/2013   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   Last Assessment & Plan:  Formatting of this note is different from the original. ASSESSMENT: improved & he's made adjustments   PLAN: Patient referred to and seen by Dr. Astrid Divine of [facility] on 11/13/2019.  They discussed the history symptoms and radiograph findings.  They address 3 levels of treatment.  Level 1-splinting ora   Other malaise and fatigue 04/15/2013   Pain in joints 04/15/2013   Panic disorder without agoraphobia 04/15/2013   Panic disorder  without agoraphobia 04/15/2013   Presbyopia 03/28/2016   PVD (posterior vitreous detachment), right eye 02/27/2012   Formatting of this note might be different from the original. Hemorrhagic, no RD or tears  Incomplete posterior vitreous separation, OS  Last Assessment & Plan:  Formatting of this note might be different from the original. Relevant Hx: Course: Daily Update: Today's Plan:   Rash and other nonspecific skin eruption 04/15/2013   Renal function test abnormal 04/15/2013   Sebaceous hyperplasia 11/12/2019   Formatting of this note might be different from the original. Seen at [facility] dermatology by PA Janese Banks for full skin exam on 11/20/2018.  Located on the face.  These are benign and enlarged oil glands within the skin.   Sebaceous hyperplasia 11/12/2019   Formatting of this note might be different from the original. Seen at [facility] dermatology by PA Janese Banks for full skin exam on 11/20/2018.  Located on the face.  These are benign and enlarged oil glands within the skin.   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 Seen at [facility] dermatology by PA Janese Banks for full skin exam on 11/20/2018.  Located on the trunk.  Next counseled that this is a yeast infection.  Reported the rash is not bothersome and opted for no treatment.   Urticaria 04/15/2013   Verruca plantaris 11/12/2019   Formatting of this note might be different from the original. Seen at [facility] dermatology by PA Janese Banks for full skin exam on 11/20/2018.  Located on the right lateral plantar midfoot.  Generally care on the palms and soles and are caused by viral infections.  The spread through direct contact.   Gust that there are treatment options and patient declined treatment that day.    Surgical History: Past Surgical History:  Procedure Laterality Date   COLONOSCOPY WITH PROPOFOL N/A 10/28/2021   Procedure: COLONOSCOPY WITH PROPOFOL;  Surgeon: [name], MD;  Location: [facility] ENDOSCOPY;  Service: Gastroenterology;  Laterality: N/A;   EXTRACORPOREAL SHOCK WAVE LITHOTRIPSY Left 09/23/2021   Procedure: EXTRACORPOREAL SHOCK WAVE LITHOTRIPSY (ESWL);  Surgeon: Abbie Sons, MD;  Location: [facility] ORS;  Service: Urology;  Laterality: Left;   KNEE SURGERY     LIPOMA EXCISION Right    RETINAL TEAR REPAIR CRYOTHERAPY     VASECTOMY      Home Medications:  Allergies as of 07/27/2022       Reactions   Banana Anaphylaxis   Other reaction(s): Other (See Comments) Tongue swelling   Latex    Other reaction(s): Other (See Comments) Burning sensation        Medication List        Accurate as of July 27, 2022  3:20 PM. If you have any questions, ask your nurse or doctor.  amLODipine 10 MG tablet Commonly known as: NORVASC Take 10 mg by mouth daily.   aspirin EC 81 MG tablet Take by mouth.   atorvastatin 40 MG tablet Commonly known as: LIPITOR Take 40 mg by mouth daily.   diclofenac Sodium 1 % Gel Commonly known as: VOLTAREN Apply 2 g topically 4 (four) times daily as needed.   enalapril 20 MG tablet Commonly known as: VASOTEC Take 20 mg by mouth daily.   febuxostat 40 MG tablet Commonly known as: ULORIC febuxostat 40 mg tablet  TAKE 1 TABLET (40 MG TOTAL) BY MOUTH ONCE DAILY FOR GOUT FLARE PREVENTION   gabapentin 300 MG capsule Commonly known as: NEURONTIN Take 300 mg by mouth daily as needed.   nicotine polacrilex 2 MG gum Commonly known as: NICORETTE Place inside cheek.   omeprazole 20 MG capsule Commonly known as: PRILOSEC Take 20 mg by mouth daily.   simethicone 80 MG chewable tablet Commonly known as: MYLICON Chew 80 mg by mouth every 6 (six)  hours as needed for flatulence.   tamsulosin 0.4 MG Caps capsule Commonly known as: FLOMAX Take 1 capsule (0.4 mg total) by mouth daily.   tiZANidine 4 MG tablet Commonly known as: Zanaflex Take 1 tablet (4 mg total) by mouth every 8 (eight) hours as needed for muscle spasms.        Allergies:  Allergies  Allergen Reactions   Banana Anaphylaxis    Other reaction(s): Other (See Comments) Tongue swelling    Latex     Other reaction(s): Other (See Comments) Burning sensation     Family History: Family History  Problem Relation Age of Onset   Heart failure Mother    Throat cancer Father    Heart disease Brother     Social History:  reports that he has never smoked. He has never used smokeless tobacco. He reports that he does not currently use alcohol. He reports that he does not use drugs.   Physical Exam: BP (!) 159/79   Pulse 83   Ht '5\' 9"'$  (1.753 m)   Wt 212 lb 3.2 oz (96.3 kg)   BMI 31.34 kg/m   Constitutional:  Alert and oriented, No acute distress. HEENT: [employer] AT, moist mucus membranes.  Trachea midline, no masses. Cardiovascular: No clubbing, cyanosis, or edema. Respiratory: Normal respiratory effort, no increased work of breathing. GI: Abdomen is soft, nontender, nondistended, no abdominal masses GU: No CVA tenderness Skin: No rashes, bruises or suspicious lesions. Neurologic: Grossly intact, no focal deficits, moving all 4 extremities. Psychiatric: Normal mood and affect.  Laboratory Data: Lab Results  Component Value Date   WBC 7.7 05/22/2022   HGB 14.9 05/22/2022   HCT 42.9 05/22/2022   MCV 89.2 05/22/2022   PLT 172 05/22/2022    Lab Results  Component Value Date   CREATININE 1.28 07/11/2022    No results found for: "PSA"  No results found for: "TESTOSTERONE"  Lab Results  Component Value Date   HGBA1C 5.4 05/09/2022    Urinalysis    Component Value Date/Time   COLORURINE Dark Yellow (A) 07/11/2022 1613   APPEARANCEUR Sl Cloudy  (A) 07/11/2022 1613   APPEARANCEUR Clear 10/14/2021 1027   LABSPEC 1.020 07/11/2022 1613   PHURINE 6.5 07/11/2022 1613   GLUCOSEU NEGATIVE 07/11/2022 1613   HGBUR LARGE (A) 07/11/2022 1613   BILIRUBINUR NEGATIVE 07/11/2022 1613   BILIRUBINUR Negative 10/14/2021 1027   KETONESUR NEGATIVE 07/11/2022 1613   PROTEINUR 100 (A) 07/08/2022 1704   UROBILINOGEN 0.2 07/11/2022  [place] 07/11/2022 [place] 07/11/2022 1613    Lab Results  Component Value Date   LABMICR See below: 10/14/2021   WBCUA 0-5 10/14/2021   LABEPIT 0-10 10/14/2021   MUCUS Presence of (A) 07/11/2022   BACTERIA RARE (A) 07/08/2022    Pertinent Imaging: *** Results for orders placed during the hospital encounter of 09/23/21  DG Abd 1 View  Narrative CLINICAL DATA:  Preop kidney stones  EXAM: ABDOMEN - 1 VIEW  COMPARISON:  [facility] 09/14/2021  FINDINGS: The bowel gas pattern is normal. No radio-opaque calculi or other significant radiographic abnormality are seen.  IMPRESSION: No definite renal calculi appreciated.   Electronically Signed By: Ofilia Neas M.D. On: 09/23/2021 09:03  No results found for this or any previous visit.  No results found for this or any previous visit.  No results found for this or any previous visit.  No results found for this or any previous visit.  No valid procedures specified. No results found for this or any previous visit.  Results for orders placed during the hospital encounter of 09/07/21  CT Renal Stone Study  Narrative CLINICAL DATA:  Left-sided flank pain  EXAM: CT ABDOMEN AND PELVIS WITHOUT CONTRAST  TECHNIQUE: Multidetector CT imaging of the abdomen and pelvis was performed following the standard protocol without IV contrast.  COMPARISON:  None.  FINDINGS: Lower chest: Lung bases demonstrate no acute consolidation or effusion. Normal cardiac size. Calcified nodule or lymph node at the left cardio phrenic  sulcus.  Hepatobiliary: Numerous hepatic cysts. Additional subcentimeter low-density liver lesions too small to further characterize. Gallstones. No biliary dilatation  Pancreas: Unremarkable. No pancreatic ductal dilatation or surrounding inflammatory changes.  Spleen: Normal in size without focal abnormality.  Adrenals/Urinary Tract: Adrenal glands are normal. Small intrarenal stones bilaterally. Mild left hydronephrosis and hydroureter, secondary to a 5 by 5 mm stone in the distal left ureter at the left UVJ.  Stomach/Bowel: Stomach is within normal limits. Appendix appears normal. No evidence of bowel wall thickening, distention, or inflammatory changes. Diverticular disease of the colon without acute inflammatory process.  Vascular/Lymphatic: Mild aortic atherosclerosis. No aneurysm. No suspicious nodes.  Reproductive: Prostate is unremarkable.  Other: Negative for pelvic effusion or free air  Musculoskeletal: No acute or significant osseous findings.  IMPRESSION: 1. Mild left hydronephrosis and hydroureter, secondary to a 5 mm stone at the left UVJ. 2. Small intrarenal stones 3. 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 Gallstones   Electronically Signed By: Donavan Foil M.D. On: 09/06/2021 21:38   Assessment & Plan:    There are no diagnoses linked to this encounter.  No follow-ups on file.  Hollice Espy, MD  [facility] [address] [place], [place] [address] [phone]

## 2022-08-10 ENCOUNTER — Telehealth: Payer: Self-pay | Admitting: Family Medicine

## 2022-08-10 NOTE — Telephone Encounter (Signed)
Copied from [place] [id number]. Topic: Medicare AWV >> Aug 10, 2022 11:01 AM Devoria Glassing wrote: Reason for CRM: Left message for patient to schedule Annual Wellness Visit.  Please schedule with Nurse Health Advisor Denisa O'Brien-Blaney, LPN at [facility]. This appt can be telephone or office visit.  Please call [phone] ask for [facility]

## 2022-08-12 ENCOUNTER — Ambulatory Visit (INDEPENDENT_AMBULATORY_CARE_PROVIDER_SITE_OTHER): Payer: Medicare PPO

## 2022-08-12 VITALS — Ht 69.0 in | Wt 212.0 lb

## 2022-08-12 DIAGNOSIS — Z0000 Encounter for general adult medical examination without abnormal findings: Secondary | ICD-10-CM

## 2022-08-12 NOTE — Progress Notes (Signed)
Subjective:   Charles Waters is a 71 y.o. male who presents for an Initial Medicare Annual Wellness Visit.  Review of Systems    No ROS.  Medicare Wellness Virtual Visit.  Visual/audio telehealth visit, UTA vital signs.   See social history for additional risk factors.   Cardiac Risk Factors include: advanced age (>24mn, >>21women);hypertension;male gender     Objective:    Today's Vitals   08/12/22 1119  Weight: 212 lb (96.2 kg)  Height: '5\' 9"'$  (1.753 m)   Body mass index is 31.31 kg/m.     08/12/2022   11:18 AM 05/22/2022    3:04 PM 10/28/2021    8:12 AM 09/23/2021    6:51 AM 09/06/2021    9:01 PM  Advanced Directives  Does Patient Have a Medical Advance Directive? No No No No No  Would patient like information on creating a medical advance directive? No - Patient declined  No - Patient declined No - Patient declined     Current Medications (verified) Outpatient Encounter Medications as of 08/12/2022  Medication Sig   amLODipine (NORVASC) 10 MG tablet Take 10 mg by mouth daily.   aspirin 81 MG EC tablet Take by mouth.   atorvastatin (LIPITOR) 40 MG tablet Take 40 mg by mouth daily.   diclofenac Sodium (VOLTAREN) 1 % GEL Apply 2 g topically 4 (four) times daily as needed.   enalapril (VASOTEC) 20 MG tablet Take 20 mg by mouth daily.   febuxostat (ULORIC) 40 MG tablet febuxostat 40 mg tablet  TAKE 1 TABLET (40 MG TOTAL) BY MOUTH ONCE DAILY FOR GOUT FLARE PREVENTION   gabapentin (NEURONTIN) 300 MG capsule Take 300 mg by mouth daily as needed.   nicotine polacrilex (NICORETTE) 2 MG gum Place inside cheek.   omeprazole (PRILOSEC) 20 MG capsule Take 20 mg by mouth daily.   simethicone (MYLICON) 80 MG chewable tablet Chew 80 mg by mouth every 6 (six) hours as needed for flatulence.   tamsulosin (FLOMAX) 0.4 MG CAPS capsule Take 1 capsule (0.4 mg total) by mouth daily.   tiZANidine (ZANAFLEX) 4 MG tablet Take 1 tablet (4 mg total) by mouth every 8 (eight) hours as needed for  muscle spasms.   No facility-administered encounter medications on file as of 08/12/2022.    Allergies (verified) Banana and Latex   History: Past Medical History:  Diagnosis Date   Abdominal hernia 04/15/2013   Abnormal weight gain 04/15/2013   Abnormal weight gain 04/15/2013   Actinic keratoses 11/12/2019   Formatting of this note might be different from the original. [facility]20/2018.  Distributed on the face.  These were precancerous proliferations that can occur with sun damaged skin.  Also subset of AK's can develop into squamous cell carcinoma.  She discussed treatment with cryotherapy versus 5-fluorouracil.  Recommended field tr   Anxiety state 04/15/2013   Anxiety state 04/15/2013   Benign essential hypertension 04/15/2013   Last Assessment & Plan:  Formatting of this note might be different from the original. ASSESSMENT: Blood pressure is well controlled here today.  Home BP readings range: no concerning readings   PLAN:  Medications: continue Amlodipine, Enalapril. -  Labs as per orders if indicated/due Check & log BP and bring readings to f/u and machine.  Low sodium/DASH diet is advised. Consistent aerobic exercis   Cataract 03/28/2012   Contact dermatitis and other eczema, due to unspecified cause 04/15/2013   Contact  dermatitis and other eczema, due to unspecified cause 04/15/2013   Depression 09/47/0962   Dysmetabolic syndrome X 83/66/2947   Family history of congestive heart failure 07/30/2017   Formatting of this note might be different from the original. 03/28/2016:  Mother & identical twin brother both with CHF. Identical twin much self inflicted-alcoholism, smoking, drugs, etc.  We discuss utility of BNP testing and/or cardiac consult. I advise the latter for full evaluation as Meric has RFs certainly with HTN & HL, white male of 64yoa. He is obese by BMI despite his routine CV exercise    Gout    Gout,  unspecified 04/15/2013   Last Assessment & Plan:  Formatting of this note might be different from the original. ASSESSMENT: stable without acute flares   PLAN: Medications: continue Uloric '40mg'$  generic  Dietary precautions  Continue routine exercise.  Weight loss encouraged.   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 AH Type I   Hypercholesterolemia 04/15/2013   Last Assessment & Plan:  Formatting of this note is different from the original. ASSESSMENT: Level of control to be determined by today's lab results  Lab Results  Component Value Date   CHOLTOTAL 109 06/08/2020   CHOLTOTAL 127 11/04/2019   CHOLTOTAL 97 07/11/2019   Lab Results  Component Value Date   HDL 48 06/08/2020   HDL 60 11/04/2019   HDL 42 07/11/2019   Lab Results  Component Value Date   L   Impaired fasting glucose 04/15/2013   Lipoma 04/15/2013   Melanocytic nevus 07/30/2017   Muscle weakness (generalized) 04/15/2013   Myopia of both eyes 07/30/2017   Neoplasm of uncertain behavior of skin 04/15/2013   Neoplasm of uncertain behavior of skin 04/15/2013   Nephrolithiasis 10/03/2018   Nephrolithiasis 10/03/2018   Nicotine dependence 07/30/2017   Obesity 04/15/2013   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   Last Assessment & Plan:  Formatting of this note is different from the original. ASSESSMENT: improved & he's made adjustments   PLAN: Patient referred to and seen by Dr. Astrid [facility] of [facility] on 11/13/2019.  They discussed the history symptoms and radiograph findings.  They address 3 levels of treatment.  Level 1-splinting ora   Other malaise and fatigue 04/15/2013   Pain in joints 04/15/2013   Panic disorder without agoraphobia 04/15/2013   Panic disorder without agoraphobia 04/15/2013   Presbyopia 03/28/2016   PVD (posterior vitreous detachment),  right eye 02/27/2012   Formatting of this note might be different from the original. Hemorrhagic, no RD or tears  Incomplete posterior vitreous separation, OS  Last Assessment & Plan:  Formatting of this note might be different from the original. Relevant Hx: Course: Daily Update: Today's Plan:   Rash and other nonspecific skin eruption 04/15/2013   Renal function test abnormal 04/15/2013   Sebaceous hyperplasia 11/12/2019   Formatting of this note might be different from the original. Seen [facility] by PA Janese Banks for full skin exam on 11/20/2018.  Located on the face.  These are benign and enlarged oil glands within the skin.   Sebaceous hyperplasia 11/12/2019   Formatting of this note might be different from the original. Seen at [facility] by PA Janese Banks for full skin exam on 11/20/2018.  Located on the face.  These are benign and enlarged oil  glands within the skin.   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 Seen at [facility] dermatology by PA Janese Banks for full skin exam on 11/20/2018.  Located on the trunk.  Next counseled that this is a yeast infection.  Reported the rash is not bothersome and opted for no treatment.   Urticaria 04/15/2013   Verruca plantaris 11/12/2019   Formatting of this note might be different from the original. Seen at [facility] dermatology by PA Janese Banks for full skin exam on 11/20/2018.  Located on the right lateral plantar midfoot.  Generally care on the palms and soles and are caused by viral infections.  The spread through direct contact.  Gust that there are treatment options and patient declined treatment that day.   Past  Surgical History:  Procedure Laterality Date   COLONOSCOPY WITH PROPOFOL N/A 10/28/2021   Procedure: COLONOSCOPY WITH PROPOFOL;  Surgeon: [name], MD;  Location: [facility] ENDOSCOPY;  Service: Gastroenterology;  Laterality: N/A;   EXTRACORPOREAL SHOCK WAVE LITHOTRIPSY Left 09/23/2021   Procedure: EXTRACORPOREAL SHOCK WAVE LITHOTRIPSY (ESWL);  Surgeon: Abbie Sons, MD;  Location: [facility] ORS;  Service: Urology;  Laterality: Left;   KNEE SURGERY     LIPOMA EXCISION Right    RETINAL TEAR REPAIR CRYOTHERAPY     VASECTOMY     Family History  Problem Relation Age of Onset   Heart failure Mother    Hypertension Father    Stroke Father    Throat cancer Father    Heart disease Brother    Social History   Socioeconomic History   Marital status: Married    Spouse name: Not on file   Number of children: Not on file   Years of education: Not on file   Highest education level: Not on file  Occupational History   Not on file  Tobacco Use   Smoking status: Never   Smokeless tobacco: Never  Vaping Use   Vaping Use: Never used  Substance and Sexual Activity   Alcohol use: Not Currently   Drug use: Never   Sexual activity: Not on file  Other Topics Concern   Not on file  Social History Narrative   Not on file   Social Determinants of Health   Financial Resource Strain: Low Risk  (08/12/2022)   Overall Financial Resource Strain (CARDIA)    Difficulty of Paying Living Expenses: Not hard at all  Food Insecurity: No Food Insecurity (08/12/2022)   Hunger Vital Sign    Worried About Running Out of Food in the Last Year: Never true    [place] in the Last Year: Never true  Transportation Needs: No Transportation Needs (08/12/2022)   PRAPARE - Hydrologist (Medical): No    Lack of Transportation (Non-Medical): No  Physical Activity: Sufficiently Active (08/12/2022)   Exercise Vital Sign    Days of Exercise per Week: 7 days    Minutes of Exercise per  Session: 150+ min  Stress: No Stress Concern Present (08/12/2022)   [place]    Feeling of Stress : Not at all  Social Connections: Unknown (08/12/2022)   Social Connection and Isolation Panel [NHANES]  Frequency of Communication with Friends and Family: Not on file    Frequency of Social Gatherings with Friends and Family: More than three times a week    Attends Religious Services: Not on file    Active Member of Clubs or Organizations: Not on file    Attends Archivist Meetings: Not on file    Marital Status: Married    Tobacco Counseling Counseling given: Not Answered   Clinical Intake:  Pre-visit preparation completed: Yes        Diabetes: No  How often do you need to have someone help you when you read instructions, pamphlets, or other written materials from your doctor or pharmacy?: 1 - Never    Interpreter Needed?: No      Activities of Daily Living    08/12/2022   11:06 AM  In your present state of health, do you have any difficulty performing the following activities:  Hearing? 0  Vision? 0  Difficulty concentrating or making decisions? 0  Walking or climbing stairs? 0  Dressing or bathing? 0  Doing errands, shopping? 0  Preparing Food and eating ? N  Using the Toilet? N  In the past six months, have you accidently leaked urine? N  Do you have problems with loss of bowel control? N  Managing your Medications? N  Managing your Finances? N  Housekeeping or managing your Housekeeping? N    Patient Care Team: Carollee Leitz, MD as PCP - General (Family Medicine)  Indicate any recent Medical Services you may have received from other than [facility] providers in the past year (date may be approximate).     Assessment:   This is a routine wellness examination for Aaronmichael.  I connected with  [name] on 08/12/22 by a audio enabled telemedicine application and verified that I am  speaking with the correct person using two identifiers.  Patient Location: Home  Provider Location: Office/Clinic  I discussed the limitations of evaluation and management by telemedicine. The patient expressed understanding and agreed to proceed.  Hearing/Vision screen Hearing Screening - Comments:: Patient has some difficulty hearing tones in R ear. Does not wear hearing aids. Per request, follow up with hearing added to upcoming office visit next week.    Vision Screening - Comments:: Wears corrective lenses Cataract extraction, bilateral They have seen their ophthalmologist in the last 12 months.    Dietary issues and exercise activities discussed: Current Exercise Habits: Home exercise routine, Type of exercise: strength training/weights;walking, Time (Minutes): > 60, Frequency (Times/Week): 5, Weekly Exercise (Minutes/Week): 0, Intensity: Moderate Healthy diet Good water intake   Goals Addressed               This Visit's Progress     Patient Stated     Maintain Healthy Lifestyle (pt-stated)        Stay active Lose maybe 2-3 lbs Healthy diet       Depression Screen    08/12/2022   11:20 AM 07/11/2022    3:17 PM 05/09/2022    1:37 PM  PHQ 2/9 Scores  PHQ - 2 Score 0 0 0    Fall Risk    08/12/2022   11:13 AM 07/11/2022    3:17 PM 05/09/2022    1:37 PM  [place] in the past year? 0 0 0  Number falls in past yr: 0 0 0  Injury with Fall? 0 0 0  Risk for fall due to : No Fall Risks No Fall  Risks No Fall Risks  Follow up Falls evaluation completed Falls evaluation completed Falls evaluation completed    [place]: Home free of loose throw rugs in walkways, pet beds, electrical cords, etc? Yes  Adequate lighting in your home to reduce risk of falls? Yes   ASSISTIVE DEVICES UTILIZED TO PREVENT FALLS: Life alert? No  Use of a cane, walker or w/c? No   TIMED UP AND GO: Was the test performed? No .   Cognitive  Function:        08/12/2022   11:28 AM  6CIT Screen  What Year? 0 points  What month? 0 points  What time? 0 points  Count back from 20 0 points  Months in reverse 0 points  Repeat phrase 0 points  Total Score 0 points    Immunizations Immunization History  Administered Date(s) Administered   Fluad Quad(high Dose 65+) 08/02/2019, 06/23/2021   Influenza, High Dose Seasonal PF 07/31/2017, 06/29/2018, 06/08/2020   Influenza,inj,Quad PF,6+ Mos 06/25/2007, 06/30/2008, 07/09/2009, 07/02/2010, 07/05/2012   Influenza-Unspecified 06/25/2007, 07/09/2009, 07/02/2010, 07/23/2013   PFIZER Comirnaty(Gray Top)Covid-19 Tri-Sucrose Vaccine 11/13/2019, 12/04/2019, 08/15/2020   Pfizer Covid-19 Vaccine Bivalent Booster 41yr & up 06/23/2021   Pneumococcal Conjugate-13 08/24/2017   Pneumococcal Polysaccharide-23 10/30/2018   Td 10/31/2005, 06/08/2020   Tdap 06/30/2008   Zoster Recombinat (Shingrix) 11/08/2018, 02/27/2019   Zoster, Live 01/07/2011   Covid-19 vaccine status: Completed vaccines x4.  Screening Tests Health Maintenance  Topic Date Due   COVID-19 Vaccine (5 - Pfizer risk series) 08/28/2022 (Originally 08/18/2021)   INFLUENZA VACCINE  12/25/2022 (Originally 04/26/2022)   Medicare Annual Wellness (AWV)  08/13/2023   COLONOSCOPY (Pts 45-423yrInsurance coverage will need to be confirmed)  10/29/2031   Pneumonia Vaccine 6584Years old  Completed   Hepatitis C Screening  Completed   Zoster Vaccines- Shingrix  Completed   HPV VACCINES  Aged Out    Health Maintenance There are no preventive care reminders to display for this patient.  Lung Cancer Screening: (Low Dose CT Chest recommended if Age 70-80ears, 30 pack-year currently smoking OR have quit w/in 15years.) does not qualify.   Hepatitis C Screening: Completed 04/2022.  Vision Screening: Recommended annual ophthalmology exams for early detection of glaucoma and other disorders of the eye.  Dental Screening: Recommended  annual dental exams for proper oral hygiene.  Community Resource Referral / Chronic Care Management: CRR required this visit?  No   CCM required this visit?  No      Plan:     I have personally reviewed and noted the following in the patient's chart:   Medical and social history Use of alcohol, tobacco or illicit drugs  Current medications and supplements including opioid prescriptions. Patient is not currently taking opioid prescriptions. Functional ability and status Nutritional status Physical activity Advanced directives List of other physicians Hospitalizations, surgeries, and ER visits in previous 12 months Vitals Screenings to include cognitive, depression, and falls Referrals and appointments  In addition, I have reviewed and discussed with patient certain preventive protocols, quality metrics, and best practice recommendations. A written personalized care plan for preventive services as well as general preventive health recommendations were provided to patient.     DeLeta JunglingLPN   [id number]

## 2022-08-12 NOTE — Patient Instructions (Addendum)
Charles Waters , Thank you for taking time to come for your Medicare Wellness Visit. I appreciate your ongoing commitment to your health goals. Please review the following plan we discussed and let me know if I can assist you in the future.   These are the goals we discussed:  Goals       Patient Stated     Maintain Healthy Lifestyle (pt-stated)      Stay active Lose maybe 2-3 lbs Healthy diet        This is a list of the screening recommended for you and due dates:  Health Maintenance  Topic Date Due   COVID-19 Vaccine (5 - Pfizer risk series) 08/28/2022*   Flu Shot  12/25/2022*   Medicare Annual Wellness Visit  08/13/2023   Colon Cancer Screening  10/29/2031   Pneumonia Vaccine  Completed   Hepatitis C Screening: USPSTF Recommendation to screen - Ages 18-79 yo.  Completed   Zoster (Shingles) Vaccine  Completed   HPV Vaccine  Aged Out  *Topic was postponed. The date shown is not the original due date.    Advanced directives: End of life planning; Advanced aging; Advanced directives discussed.  No HCPOA/Living Will.  Additional information in office. Declined at this time.  Conditions/risks identified: none new  Next appointment: Follow up in one year for your annual wellness visit.   Preventive Care 63 Years and Older, Male  Preventive care refers to lifestyle choices and visits with your health care provider that can promote health and wellness. What does preventive care include? A yearly physical exam. This is also called an annual well check. Dental exams once or twice a year. Routine eye exams. Ask your health care provider how often you should have your eyes checked. Personal lifestyle choices, including: Daily care of your teeth and gums. Regular physical activity. Eating a healthy diet. Avoiding tobacco and drug use. Limiting alcohol use. Practicing safe sex. Taking low doses of aspirin every day. Taking vitamin and mineral supplements as recommended by your  health care provider. What happens during an annual well check? The services and screenings done by your health care provider during your annual well check will depend on your age, overall health, lifestyle risk factors, and family history of disease. Counseling  Your health care provider may ask you questions about your: Alcohol use. Tobacco use. Drug use. Emotional well-being. Home and relationship well-being. Sexual activity. Eating habits. History of falls. Memory and ability to understand (cognition). Work and work Statistician. Screening  You may have the following tests or measurements: Height, weight, and BMI. Blood pressure. Lipid and cholesterol levels. These may be checked every 5 years, or more frequently if you are over 35 years old. Skin check. Lung cancer screening. You may have this screening every year starting at age 25 if you have a 30-pack-year history of smoking and currently smoke or have quit within the past 15 years. Fecal occult blood test (FOBT) of the stool. You may have this test every year starting at age 61. Flexible sigmoidoscopy or colonoscopy. You may have a sigmoidoscopy every 5 years or a colonoscopy every 10 years starting at age 22. Prostate cancer screening. Recommendations will vary depending on your family history and other risks. Hepatitis C blood test. Hepatitis B blood test. Sexually transmitted disease (STD) testing. Diabetes screening. This is done by checking your blood sugar (glucose) after you have not eaten for a while (fasting). You may have this done every 1-3 years. Abdominal aortic aneurysm (  AAA) screening. You may need this if you are a current or former smoker. Osteoporosis. You may be screened starting at age 39 if you are at high risk. Talk with your health care provider about your test results, treatment options, and if necessary, the need for more tests. Vaccines  Your health care provider may recommend certain vaccines, such  as: Influenza vaccine. This is recommended every year. Tetanus, diphtheria, and acellular pertussis (Tdap, Td) vaccine. You may need a Td booster every 10 years. Zoster vaccine. You may need this after age 5. Pneumococcal 13-valent conjugate (PCV13) vaccine. One dose is recommended after age 4. Pneumococcal polysaccharide (PPSV23) vaccine. One dose is recommended after age 19. Talk to your health care provider about which screenings and vaccines you need and how often you need them. This information is not intended to replace advice given to you by your health care provider. Make sure you discuss any questions you have with your health care provider. Document Released: 10/09/2015 Document Revised: 06/01/2016 Document Reviewed: 07/14/2015 Elsevier Interactive Patient Education  2017 [place] Prevention in the Home Falls can cause injuries. They can happen to people of all ages. There are many things you can do to make your home safe and to help prevent falls. What can I do on the outside of my home? Regularly fix the edges of walkways and driveways and fix any cracks. Remove anything that might make you trip as you walk through a door, such as a raised step or threshold. Trim any bushes or trees on the path to your home. Use bright outdoor lighting. Clear any walking paths of anything that might make someone trip, such as rocks or tools. Regularly check to see if handrails are loose or broken. Make sure that both sides of any steps have handrails. Any raised decks and porches should have guardrails on the edges. Have any leaves, snow, or ice cleared regularly. Use sand or salt on walking paths during winter. Clean up any spills in your garage right away. This includes oil or grease spills. What can I do in the bathroom? Use night lights. Install grab bars by the toilet and in the tub and shower. Do not use towel bars as grab bars. Use non-skid mats or decals in the tub or  shower. If you need to sit down in the shower, use a plastic, non-slip stool. Keep the floor dry. Clean up any water that spills on the floor as soon as it happens. Remove soap buildup in the tub or shower regularly. Attach bath mats securely with double-sided non-slip rug tape. Do not have throw rugs and other things on the floor that can make you trip. What can I do in the bedroom? Use night lights. Make sure that you have a light by your bed that is easy to reach. Do not use any sheets or blankets that are too big for your bed. They should not hang down onto the floor. Have a firm chair that has side arms. You can use this for support while you get dressed. Do not have throw rugs and other things on the floor that can make you trip. What can I do in the kitchen? Clean up any spills right away. Avoid walking on wet floors. Keep items that you use a lot in easy-to-reach places. If you need to reach something above you, use a strong step stool that has a grab bar. Keep electrical cords out of the way. Do not use floor polish  or wax that makes floors slippery. If you must use wax, use non-skid floor wax. Do not have throw rugs and other things on the floor that can make you trip. What can I do with my stairs? Do not leave any items on the stairs. Make sure that there are handrails on both sides of the stairs and use them. Fix handrails that are broken or loose. Make sure that handrails are as long as the stairways. Check any carpeting to make sure that it is firmly attached to the stairs. Fix any carpet that is loose or worn. Avoid having throw rugs at the top or bottom of the stairs. If you do have throw rugs, attach them to the floor with carpet tape. Make sure that you have a light switch at the top of the stairs and the bottom of the stairs. If you do not have them, ask someone to add them for you. What else can I do to help prevent falls? Wear shoes that: Do not have high heels. Have  rubber bottoms. Are comfortable and fit you well. Are closed at the toe. Do not wear sandals. If you use a stepladder: Make sure that it is fully opened. Do not climb a closed stepladder. Make sure that both sides of the stepladder are locked into place. Ask someone to hold it for you, if possible. Clearly mark and make sure that you can see: Any grab bars or handrails. First and last steps. Where the edge of each step is. Use tools that help you move around (mobility aids) if they are needed. These include: Canes. Walkers. Scooters. Crutches. Turn on the lights when you go into a dark area. Replace any light bulbs as soon as they burn out. Set up your furniture so you have a clear path. Avoid moving your furniture around. If any of your floors are uneven, fix them. If there are any pets around you, be aware of where they are. Review your medicines with your doctor. Some medicines can make you feel dizzy. This can increase your chance of falling. Ask your doctor what other things that you can do to help prevent falls. This information is not intended to replace advice given to you by your health care provider. Make sure you discuss any questions you have with your health care provider. Document Released: 07/09/2009 Document Revised: 02/18/2016 Document Reviewed: 10/17/2014 Elsevier Interactive Patient Education  2017 [employer].

## 2022-08-16 ENCOUNTER — Encounter: Payer: Self-pay | Admitting: Family Medicine

## 2022-08-16 ENCOUNTER — Ambulatory Visit: Payer: Medicare PPO | Admitting: Family Medicine

## 2022-08-16 VITALS — BP 120/70 | HR 81 | Temp 98.1°F | Ht 69.0 in | Wt 213.0 lb

## 2022-08-16 DIAGNOSIS — E7800 Pure hypercholesterolemia, unspecified: Secondary | ICD-10-CM | POA: Diagnosis not present

## 2022-08-16 DIAGNOSIS — R82994 Hypercalciuria: Secondary | ICD-10-CM

## 2022-08-16 DIAGNOSIS — I10 Essential (primary) hypertension: Secondary | ICD-10-CM | POA: Diagnosis not present

## 2022-08-16 DIAGNOSIS — Z0000 Encounter for general adult medical examination without abnormal findings: Secondary | ICD-10-CM | POA: Diagnosis not present

## 2022-08-16 DIAGNOSIS — R7989 Other specified abnormal findings of blood chemistry: Secondary | ICD-10-CM | POA: Diagnosis not present

## 2022-08-16 DIAGNOSIS — R7309 Other abnormal glucose: Secondary | ICD-10-CM

## 2022-08-16 NOTE — Patient Instructions (Addendum)
It was a pleasure meeting you today. Thank you for allowing me to take part in your health care.  Our goals for today as we discussed include:  We will get some labs 1 week before your next annual visit.  If they are abnormal or we need to do something about them, I will call you.  If they are normal, I will send you a message on MyChart (if it is active) or a letter in the mail.  If you don't hear from [place] in 2 weeks, please call the office at the number below.    If you have any questions or concerns, please do not hesitate to call the office at [phone].  I look forward to our next visit and until then take care and stay safe.  Regards,   Carollee Leitz, MD   [facility]

## 2022-08-16 NOTE — Progress Notes (Signed)
SUBJECTIVE:   Chief Complaint  Patient presents with   Follow-up    On kidney issues   HPI Patient presents to clinic to follow-up on blood pressure, liver enzymes and kidney issues.  Hypertension Asymptomatic.  Blood pressures at home remain less than 120/80.  Compliant with amlodipine 10 mg daily and enalapril 20 mg daily.  Recent creatinine improved 1.28, GFR 56.8.  Elevated liver enzymes Elevated ALT on 05/22/2022 from an encounter at urgent care.  Repeat LFTs on 07/11/2022 returned to normal.  Denies any fevers, abdominal pain, nausea/vomiting, myalgias or EtOH use.    Nephrolithiasis. Patient was evaluated by urology.  Likely right side nephrolithiasis with passing stone.  No prophylactic treatment indicated.  PERTINENT PMH / PSH: Hypertension Osteoarthritis Hypercalciuria Nephrolithiasis Hypercholesterolemia   OBJECTIVE:  BP 120/70 (BP Location: Left Arm, Patient Position: Sitting, Cuff Size: Normal)   Pulse 81   Temp 98.1 F (36.7 C) (Oral)   Ht '5\' 9"'$  (1.753 m)   Wt 213 lb (96.6 kg)   SpO2 96%   BMI 31.45 kg/m    Physical Exam Vitals reviewed.  Constitutional:      General: He is not in acute distress.    Appearance: Normal appearance. He is normal weight. He is not ill-appearing, toxic-appearing or diaphoretic.  Eyes:     General:        Right eye: No discharge.        Left eye: No discharge.  Cardiovascular:     Rate and Rhythm: Normal rate and regular rhythm.     Heart sounds: Normal heart sounds.  Pulmonary:     Effort: Pulmonary effort is normal.     Breath sounds: Normal breath sounds.  Abdominal:     General: Bowel sounds are normal.  Musculoskeletal:        General: Normal range of motion.     Cervical back: Normal range of motion.  Skin:    General: Skin is warm and dry.  Neurological:     Mental Status: He is alert and oriented to person, place, and time. Mental status is at baseline.  Psychiatric:        Mood and Affect: Mood  normal.        Behavior: Behavior normal.        Thought Content: Thought content normal.        Judgment: Judgment normal.     ASSESSMENT/PLAN:  Benign essential hypertension Assessment & Plan: Well-controlled Continue amlodipine 10 mg daily Continue enalapril 20 mg daily.   Orders: -     Comprehensive metabolic panel; Future -     Enalapril Maleate; Take 1 tablet (20 mg total) by mouth daily.  Dispense: 90 tablet; Refill: 3 -     amLODIPine Besylate; Take 1 tablet (10 mg total) by mouth daily.  Dispense: 90 tablet; Refill: 3  Annual physical exam -     CBC with Differential/Platelet; Future -     Vitamin B12; Future  Hypercalciuria Assessment & Plan: Increase fluid intake Increase fruits and vegetables Limit sodium intake Avoid calcium supplementation    Orders: -     VITAMIN D 25 Hydroxy (Vit-D Deficiency, Fractures); Future  Elevated LFTs Assessment & Plan: resolved   Hypercholesterolemia -     Lipid panel; Future  Abnormal glucose -     Hemoglobin A1c; Future   PDMP reviewed  Return in about 1 year (around 08/17/2023) for annual visit with fasting labs 1 week prior, RN clinic.  Carollee Leitz, MD

## 2022-09-03 ENCOUNTER — Encounter: Payer: Self-pay | Admitting: Family Medicine

## 2022-09-03 DIAGNOSIS — R7309 Other abnormal glucose: Secondary | ICD-10-CM | POA: Insufficient documentation

## 2022-09-03 DIAGNOSIS — Z0000 Encounter for general adult medical examination without abnormal findings: Secondary | ICD-10-CM | POA: Insufficient documentation

## 2022-09-03 MED ORDER — AMLODIPINE BESYLATE 10 MG PO TABS: 10.0000 mg | ORAL_TABLET | Freq: Every day | ORAL | 3 refills | Status: DC

## 2022-09-03 MED ORDER — ENALAPRIL MALEATE 20 MG PO TABS: 20.0000 mg | ORAL_TABLET | Freq: Every day | ORAL | 3 refills | Status: DC

## 2022-09-03 NOTE — Assessment & Plan Note (Signed)
Increase fluid intake Increase fruits and vegetables Limit sodium intake Avoid calcium supplementation

## 2022-09-03 NOTE — Assessment & Plan Note (Signed)
Well-controlled Continue amlodipine 10 mg daily Continue enalapril 20 mg daily.

## 2022-09-03 NOTE — Assessment & Plan Note (Signed)
resolved 

## 2022-10-15 ENCOUNTER — Other Ambulatory Visit: Payer: Self-pay | Admitting: Family Medicine

## 2022-10-15 DIAGNOSIS — N200 Calculus of kidney: Secondary | ICD-10-CM

## 2022-12-21 ENCOUNTER — Telehealth: Payer: Self-pay

## 2022-12-21 ENCOUNTER — Other Ambulatory Visit: Payer: Self-pay | Admitting: Family

## 2022-12-21 DIAGNOSIS — N200 Calculus of kidney: Secondary | ICD-10-CM

## 2022-12-21 MED ORDER — ATORVASTATIN CALCIUM 40 MG PO TABS: 40.0000 mg | ORAL_TABLET | Freq: Every day | ORAL | 1 refills | Status: DC

## 2023-05-04 ENCOUNTER — Encounter: Payer: Self-pay | Admitting: Family Medicine

## 2023-05-04 ENCOUNTER — Ambulatory Visit: Payer: Medicare PPO | Admitting: Family Medicine

## 2023-05-04 VITALS — BP 132/78 | HR 76 | Temp 97.6°F | Ht 69.0 in | Wt 211.4 lb

## 2023-05-04 DIAGNOSIS — I10 Essential (primary) hypertension: Secondary | ICD-10-CM

## 2023-05-04 DIAGNOSIS — R7309 Other abnormal glucose: Secondary | ICD-10-CM

## 2023-05-04 DIAGNOSIS — F39 Unspecified mood [affective] disorder: Secondary | ICD-10-CM | POA: Diagnosis not present

## 2023-05-04 DIAGNOSIS — E7800 Pure hypercholesterolemia, unspecified: Secondary | ICD-10-CM | POA: Diagnosis not present

## 2023-05-04 MED ORDER — CITALOPRAM HYDROBROMIDE 10 MG PO TABS: 10.0000 mg | ORAL_TABLET | Freq: Every day | ORAL | 0 refills | Status: DC

## 2023-05-04 NOTE — Progress Notes (Signed)
SUBJECTIVE:   Chief Complaint  Patient presents with   Acute Visit    Anxiety, trouble staying asleep   HPI Presents to clinic for an acute visit  Increased stress recently in life Exwife recently reconnected and wants to restart relationship.  Patient is upset that she walked out years ago.   Having difficulty with daughter.  She recently decided she did not want to complete grad school.  He reports he cosigned on a school loan and is currently making payments for her.  She is not working and is planning for a wedding.  He has not been sleeping well given this new information and continues to have some worried thoughts about how this will affect him financially and emotionally.  PERTINENT PMH / PSH: Mood disorder  OBJECTIVE:  BP 132/78   Pulse 76   Temp 97.6 F (36.4 C) (Oral)   Ht 5\' 9"  (1.753 m)   Wt 211 lb 6.4 oz (95.9 kg)   SpO2 98%   BMI 31.22 kg/m    Physical Exam Vitals reviewed.  Constitutional:      General: He is not in acute distress.    Appearance: Normal appearance. He is not ill-appearing, toxic-appearing or diaphoretic.  Eyes:     General:        Right eye: No discharge.        Left eye: No discharge.  Cardiovascular:     Rate and Rhythm: Normal rate.  Pulmonary:     Effort: Pulmonary effort is normal.  Abdominal:     General: Bowel sounds are normal.  Musculoskeletal:     Cervical back: Normal range of motion.  Neurological:     Mental Status: He is alert and oriented to person, place, and time. Mental status is at baseline.  Psychiatric:        Mood and Affect: Mood is anxious.        Speech: Speech normal.        Behavior: Behavior normal.        Thought Content: Thought content normal. Thought content does not include homicidal or suicidal ideation. Thought content does not include homicidal or suicidal plan.        Judgment: Judgment normal.        05/04/2023    1:53 PM 08/12/2022   11:20 AM 07/11/2022    3:17 PM 05/09/2022    1:37  PM  Depression screen PHQ 2/9  Decreased Interest 0 0 0 0  Down, Depressed, Hopeless 0 0 0 0  PHQ - 2 Score 0 0 0 0  Altered sleeping 2     Tired, decreased energy 0     Change in appetite 0     Feeling bad or failure about yourself  0     Moving slowly or fidgety/restless 0     Suicidal thoughts 0     PHQ-9 Score 2     Difficult doing work/chores Very difficult         05/04/2023    1:54 PM  GAD 7 : Generalized Anxiety Score  Nervous, Anxious, on Edge 0  Control/stop worrying 0  Worry too much - different things 0  Trouble relaxing 1  Restless 0  Easily annoyed or irritable 1  Afraid - awful might happen 0  Total GAD 7 Score 2  Anxiety Difficulty Very difficult    ASSESSMENT/PLAN:  Mood disorder (HCC) Assessment & Plan: History of depression Increasing stressors with family life contributing to lack of sleep  and increased worry Start Celexa 10 mg daily Recommend CBT Mental health resources provided Patient to notify MD in 1 week of any side effects of medication Follow up in 2 weeks  Orders: -     Citalopram Hydrobromide; Take 1 tablet (10 mg total) by mouth daily.  Dispense: 30 tablet; Refill: 0 -     Ambulatory referral to Psychology  Hypercholesterolemia -     Lipid panel; Future  Benign essential hypertension -     Comprehensive metabolic panel; Future  Abnormal glucose -     Hemoglobin A1c; Future   PDMP reviewed  Return in about 2 weeks (around 05/18/2023) for PCP.  Dana Allan, MD

## 2023-05-04 NOTE — Patient Instructions (Addendum)
It was a pleasure meeting you today. Thank you for allowing me to take part in your health care.  Our goals for today as we discussed include:  Schedule a lab appointment.  Please fast for 10-12 hours before lab appointment.   Start Celexa 10 mg daily  Notify MD of any side effects in 1 week  Follow up in 2 weeks  Referral sent to psychology  [email or web address]  [email or web address]  [facility]  [address] [place] [address] [phone]    For Mental Health Concerns  [facility] [phone] Address: [address] [place], [place] [address] Hours: Open 24/7, No appointment required.     If you have any questions or concerns, please do not hesitate to call the office at [phone].  I look forward to our next visit and until then take care and stay safe.  Regards,   Dana Allan, MD   [facility]

## 2023-05-20 ENCOUNTER — Encounter: Payer: Self-pay | Admitting: Family Medicine

## 2023-05-20 DIAGNOSIS — F39 Unspecified mood [affective] disorder: Secondary | ICD-10-CM | POA: Insufficient documentation

## 2023-05-20 NOTE — Assessment & Plan Note (Signed)
History of depression Increasing stressors with family life contributing to lack of sleep and increased worry Start Celexa 10 mg daily Recommend CBT Mental health resources provided Patient to notify MD in 1 week of any side effects of medication Follow up in 2 weeks

## 2023-05-22 ENCOUNTER — Ambulatory Visit: Payer: Medicare PPO | Admitting: Family Medicine

## 2023-05-22 ENCOUNTER — Encounter: Payer: Self-pay | Admitting: Family Medicine

## 2023-05-22 VITALS — BP 120/68 | HR 66 | Temp 97.9°F | Resp 16 | Ht 69.0 in | Wt 211.2 lb

## 2023-05-22 DIAGNOSIS — E538 Deficiency of other specified B group vitamins: Secondary | ICD-10-CM | POA: Diagnosis not present

## 2023-05-22 DIAGNOSIS — R7309 Other abnormal glucose: Secondary | ICD-10-CM

## 2023-05-22 DIAGNOSIS — E7800 Pure hypercholesterolemia, unspecified: Secondary | ICD-10-CM

## 2023-05-22 DIAGNOSIS — E559 Vitamin D deficiency, unspecified: Secondary | ICD-10-CM | POA: Insufficient documentation

## 2023-05-22 DIAGNOSIS — I10 Essential (primary) hypertension: Secondary | ICD-10-CM | POA: Diagnosis not present

## 2023-05-22 DIAGNOSIS — F39 Unspecified mood [affective] disorder: Secondary | ICD-10-CM

## 2023-05-22 LAB — HEMOGLOBIN A1C: Hgb A1c MFr Bld: 5.2 % (ref 4.6–6.5)

## 2023-05-22 LAB — COMPREHENSIVE METABOLIC PANEL
ALT: 38 U/L (ref 0–53)
AST: 28 U/L (ref 0–37)
Albumin: 4.1 g/dL (ref 3.5–5.2)
Alkaline Phosphatase: 74 U/L (ref 39–117)
BUN: 19 mg/dL (ref 6–23)
CO2: 29 mEq/L (ref 19–32)
Calcium: 10.7 mg/dL — ABNORMAL HIGH (ref 8.4–10.5)
Chloride: 103 mEq/L (ref 96–112)
Creatinine, Ser: 1.36 mg/dL (ref 0.40–1.50)
GFR: 52.5 mL/min — ABNORMAL LOW (ref >60.00)
Glucose, Bld: 98 mg/dL (ref 70–99)
Potassium: 4.1 mEq/L (ref 3.5–5.1)
Sodium: 139 mEq/L (ref 135–145)
Total Bilirubin: 0.7 mg/dL (ref 0.2–1.2)
Total Protein: 6.8 g/dL (ref 6.0–8.3)

## 2023-05-22 LAB — LIPID PANEL
Cholesterol: 115 mg/dL (ref 0–200)
HDL: 46 mg/dL (ref >39.00)
LDL Cholesterol: 47 mg/dL (ref 0–99)
NonHDL: 69.25
Total CHOL/HDL Ratio: 3
Triglycerides: 111 mg/dL (ref 0.0–149.0)
VLDL: 22.2 mg/dL (ref 0.0–40.0)

## 2023-05-22 LAB — VITAMIN D 25 HYDROXY (VIT D DEFICIENCY, FRACTURES): VITD: 78.9 ng/mL (ref 30.00–100.00)

## 2023-05-22 LAB — VITAMIN B12: Vitamin B-12: 624 pg/mL (ref 211–911)

## 2023-05-22 MED ORDER — CITALOPRAM HYDROBROMIDE 10 MG PO TABS: 10.0000 mg | ORAL_TABLET | Freq: Every day | ORAL | 3 refills | Status: DC

## 2023-05-22 NOTE — Patient Instructions (Signed)
It was a pleasure meeting you today. Thank you for allowing me to take part in your health care.  Our goals for today as we discussed include:  Continue Celexa 10 mg daily Refill sent  We will get some labs today.  If they are abnormal or we need to do something about them, I will call you.  If they are normal, I will send you a message on MyChart (if it is active) or a letter in the mail.  If you don't hear from [place] in 2 weeks, please call the office at the number below.   Follow up as needed   If you have any questions or concerns, please do not hesitate to call the office at [phone].  I look forward to our next visit and until then take care and stay safe.  Regards,   Dana Allan, MD   [facility]

## 2023-05-22 NOTE — Assessment & Plan Note (Addendum)
Improving Refill Celexa 10 mg daily Follow up with therapist as scheduled Follow up as needed

## 2023-05-22 NOTE — Progress Notes (Signed)
SUBJECTIVE:   Chief Complaint  Patient presents with   Medical Management of Chronic Issues   HPI Presents to clinic for follow up mood disorder  Recently started Celexa 10 mg daily.  Tolerating medication well.  Reports sleep has improved. No decrease in appetite. Reports mood has significantly improved Spoke with daughter and has come to decision regarding finances.  Things are improving. Has decided with exwife to slowly work things out. Has scheduled appointment with therapist in 2 weeks  PERTINENT PMH / PSH: Mood disorder  OBJECTIVE:  BP 120/68   Pulse 66   Temp 97.9 F (36.6 C)   Resp 16   Ht 5\' 9"  (1.753 m)   Wt 211 lb 4 oz (95.8 kg)   SpO2 97%   BMI 31.20 kg/m    Physical Exam Vitals reviewed.  Constitutional:      General: He is not in acute distress.    Appearance: Normal appearance. He is normal weight. He is not ill-appearing, toxic-appearing or diaphoretic.  Eyes:     General:        Right eye: No discharge.        Left eye: No discharge.  Cardiovascular:     Rate and Rhythm: Normal rate.     Heart sounds: Normal heart sounds.  Pulmonary:     Effort: Pulmonary effort is normal.  Musculoskeletal:        General: Normal range of motion.     Cervical back: Normal range of motion.  Skin:    General: Skin is warm and dry.  Neurological:     Mental Status: He is alert and oriented to person, place, and time. Mental status is at baseline.  Psychiatric:        Attention and Perception: Attention normal.        Mood and Affect: Mood is depressed.        Speech: Speech normal.        Behavior: Behavior normal.        Thought Content: Thought content normal.        Cognition and Memory: Cognition and memory normal.        Judgment: Judgment normal.        05/22/2023    8:42 AM 05/04/2023    1:53 PM 08/12/2022   11:20 AM 07/11/2022    3:17 PM 05/09/2022    1:37 PM  Depression screen PHQ 2/9  Decreased Interest 0 0 0 0 0  Down, Depressed, Hopeless  0 0 0 0 0  PHQ - 2 Score 0 0 0 0 0  Altered sleeping 0 2     Tired, decreased energy 0 0     Change in appetite 0 0     Feeling bad or failure about yourself  0 0     Trouble concentrating 0      Moving slowly or fidgety/restless 0 0     Suicidal thoughts 0 0     PHQ-9 Score 0 2     Difficult doing work/chores Not difficult at all Very difficult         05/22/2023    8:42 AM 05/04/2023    1:54 PM  GAD 7 : Generalized Anxiety Score  Nervous, Anxious, on Edge 0 0  Control/stop worrying 0 0  Worry too much - different things 0 0  Trouble relaxing 0 1  Restless 0 0  Easily annoyed or irritable 0 1  Afraid - awful might happen 0 0  Total GAD 7 Score 0 2  Anxiety Difficulty Not difficult at all Very difficult    ASSESSMENT/PLAN:  Mood disorder (HCC) Assessment & Plan: Improving Refill Celexa 10 mg daily Follow up with therapist as scheduled Follow up as needed    Orders: -     Citalopram Hydrobromide; Take 1 tablet (10 mg total) by mouth daily.  Dispense: 90 tablet; Refill: 3  Benign essential hypertension -     Comprehensive metabolic panel  Hypercholesterolemia -     Lipid panel  Abnormal glucose -     Hemoglobin A1c  Vitamin D deficiency -     VITAMIN D 25 Hydroxy (Vit-D Deficiency, Fractures)  Vitamin B 12 deficiency -     Vitamin B12    PDMP reviewed  Return if symptoms worsen or fail to improve.  Dana Allan, MD

## 2023-06-21 ENCOUNTER — Other Ambulatory Visit: Payer: Self-pay | Admitting: Family

## 2023-06-21 DIAGNOSIS — N200 Calculus of kidney: Secondary | ICD-10-CM

## 2023-08-04 ENCOUNTER — Telehealth: Payer: Self-pay | Admitting: Family Medicine

## 2023-08-04 NOTE — Telephone Encounter (Signed)
Copied from CRM [id number]. Topic: Medicare AWV >> Aug 04, 2023 10:19 AM Payton Doughty wrote: Reason for CRM: Called LVM 08/04/2023 to schedule Annual Wellness Visit  Verlee Rossetti; Care Guide Ambulatory Clinical Support [place] [facility] Direct Dial: [phone]

## 2023-08-29 DIAGNOSIS — L578 Other skin changes due to chronic exposure to nonionizing radiation: Secondary | ICD-10-CM | POA: Diagnosis not present

## 2023-08-29 DIAGNOSIS — D485 Neoplasm of uncertain behavior of skin: Secondary | ICD-10-CM | POA: Diagnosis not present

## 2023-08-29 DIAGNOSIS — L2989 Other pruritus: Secondary | ICD-10-CM | POA: Diagnosis not present

## 2023-08-29 DIAGNOSIS — D225 Melanocytic nevi of trunk: Secondary | ICD-10-CM | POA: Diagnosis not present

## 2023-08-29 DIAGNOSIS — D2362 Other benign neoplasm of skin of left upper limb, including shoulder: Secondary | ICD-10-CM | POA: Diagnosis not present

## 2023-08-29 DIAGNOSIS — R208 Other disturbances of skin sensation: Secondary | ICD-10-CM | POA: Diagnosis not present

## 2023-08-29 DIAGNOSIS — D2261 Melanocytic nevi of right upper limb, including shoulder: Secondary | ICD-10-CM | POA: Diagnosis not present

## 2023-08-29 DIAGNOSIS — L538 Other specified erythematous conditions: Secondary | ICD-10-CM | POA: Diagnosis not present

## 2023-08-29 DIAGNOSIS — D2262 Melanocytic nevi of left upper limb, including shoulder: Secondary | ICD-10-CM | POA: Diagnosis not present

## 2023-09-01 ENCOUNTER — Telehealth: Payer: Self-pay | Admitting: Family Medicine

## 2023-09-01 DIAGNOSIS — N200 Calculus of kidney: Secondary | ICD-10-CM

## 2023-09-04 ENCOUNTER — Other Ambulatory Visit: Payer: Self-pay

## 2023-09-04 DIAGNOSIS — N200 Calculus of kidney: Secondary | ICD-10-CM

## 2023-09-04 MED ORDER — ATORVASTATIN CALCIUM 40 MG PO TABS
40.0000 mg | ORAL_TABLET | Freq: Every day | ORAL | 1 refills | Status: DC
Start: 2023-09-04 — End: 2024-03-07

## 2023-09-04 NOTE — Telephone Encounter (Signed)
Prescription Request  09/04/2023  LOV: 05/22/2023  What is the name of the medication or equipment? febuxostat and atorvastatin  Have you contacted your pharmacy to request a refill? No   Which pharmacy would you like this sent to?  [id number] [place] [address] [place] [address] Phone: [phone] Fax: [phone]    Patient notified that their request is being sent to the clinical staff for review and that they should receive a response within 2 business days.   Please advise at Mobile [phone] (mobile)

## 2023-09-04 NOTE — Telephone Encounter (Signed)
RX has been sent to pharmacy.  

## 2023-10-05 ENCOUNTER — Other Ambulatory Visit: Payer: Self-pay | Admitting: Family Medicine

## 2023-10-05 DIAGNOSIS — I10 Essential (primary) hypertension: Secondary | ICD-10-CM

## 2023-11-22 IMAGING — CT CT RENAL STONE PROTOCOL
2 of 4 series · 16 of 46 positions shown, 18 images · non-contrast
Comparison: None.

CLINICAL DATA: Left-sided flank pain

EXAM:
CT ABDOMEN AND PELVIS WITHOUT CONTRAST
TECHNIQUE: Multidetector CT imaging of the abdomen and pelvis was performed
following the standard protocol without IV contrast.

[Series 2: stone full standard · axial · 0.91mm/px · z∈[-1076,-601]mm · 13 of 105 slices shown, 15 images]
[im 5/105  soft-tissue]
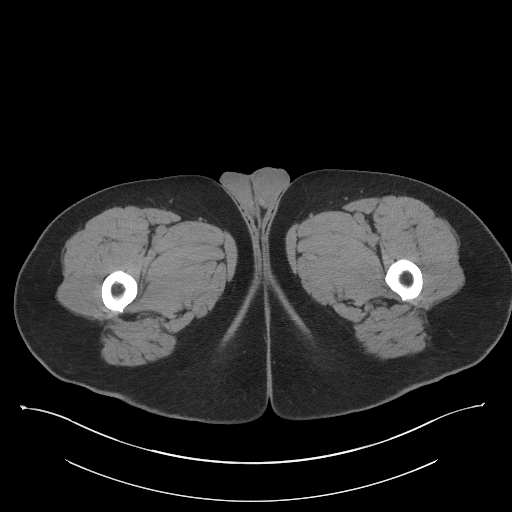
[im 5/105  bone]
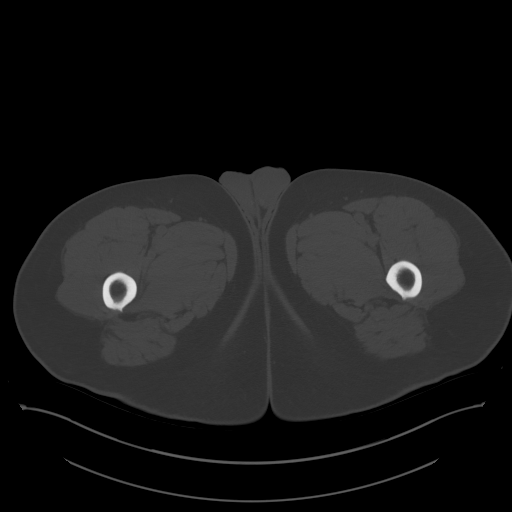
[im 15/105  soft-tissue]
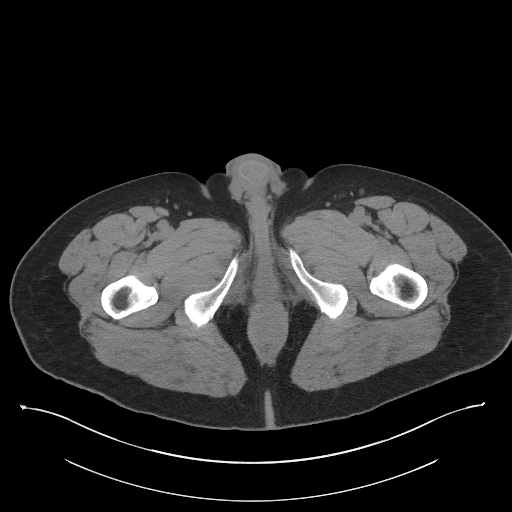
[im 24/105  soft-tissue]
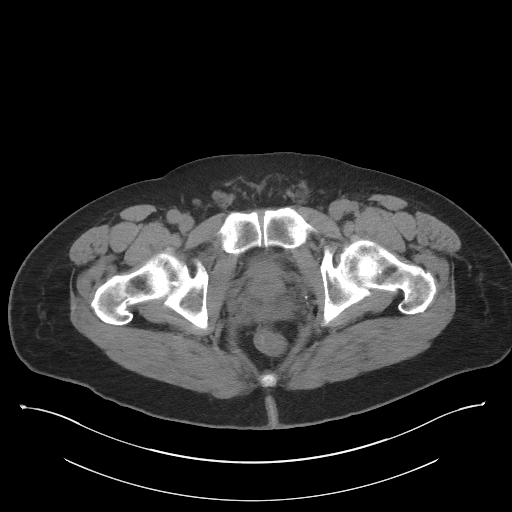
[im 29/105  soft-tissue]
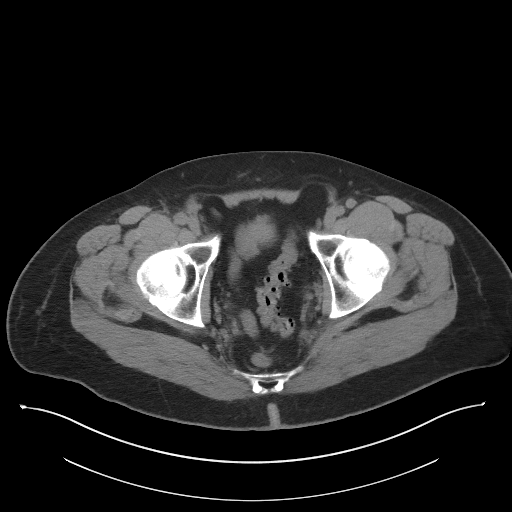
[im 38/105  soft-tissue]
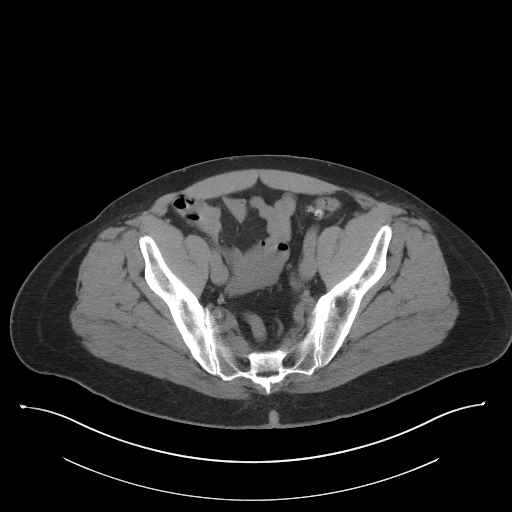
[im 43/105  soft-tissue]
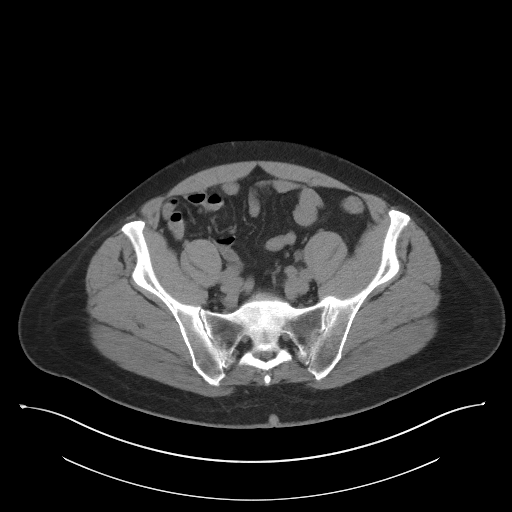
[im 53/105  soft-tissue]
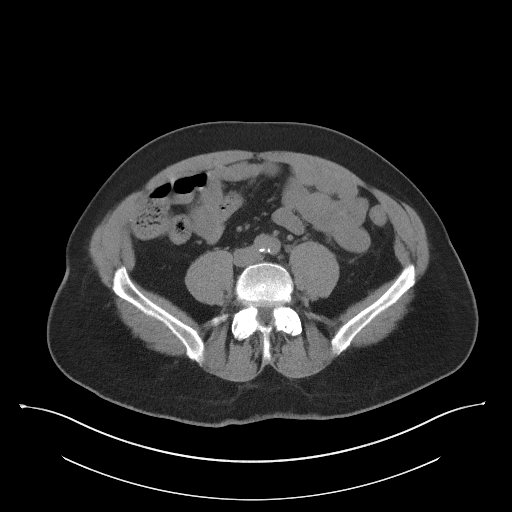
[im 62/105  soft-tissue]
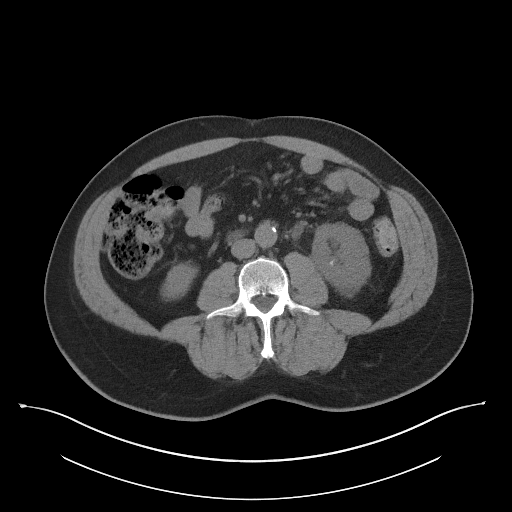
[im 67/105  soft-tissue]
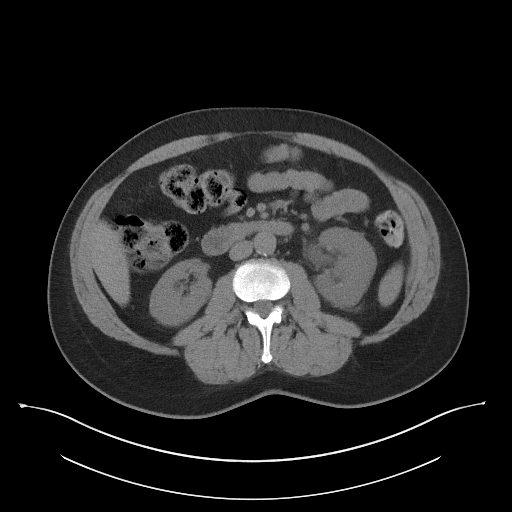
[im 67/105  bone]
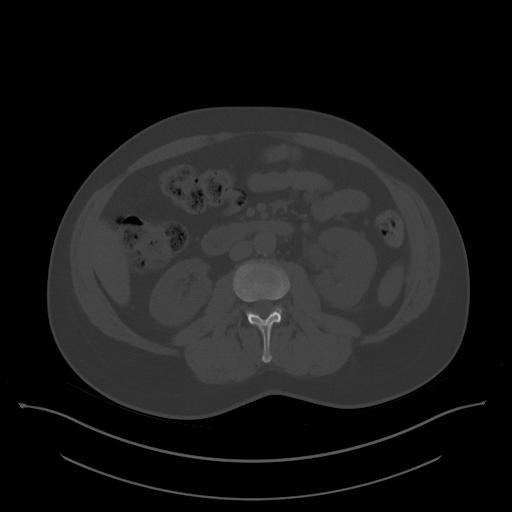
[im 76/105  soft-tissue]
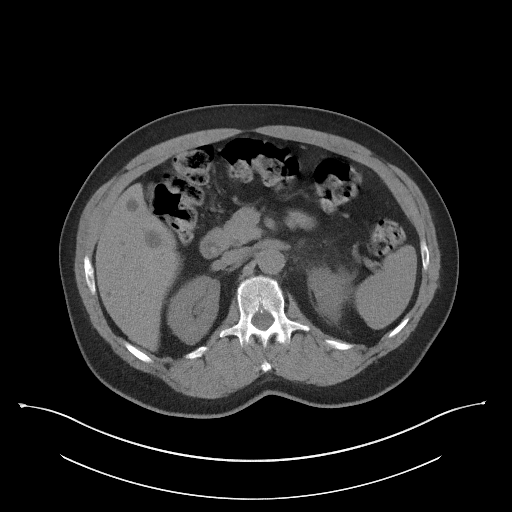
[im 81/105  soft-tissue]
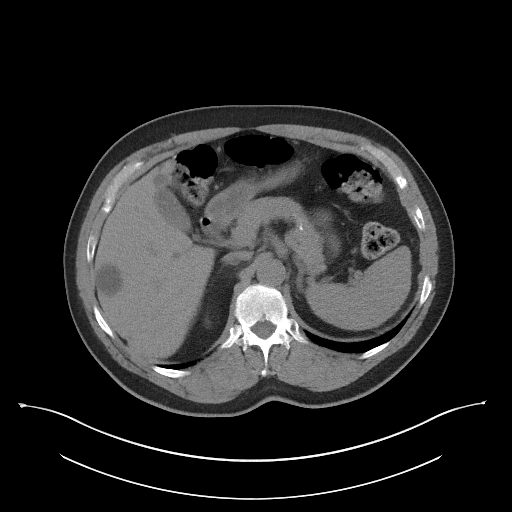
[im 90/105  soft-tissue]
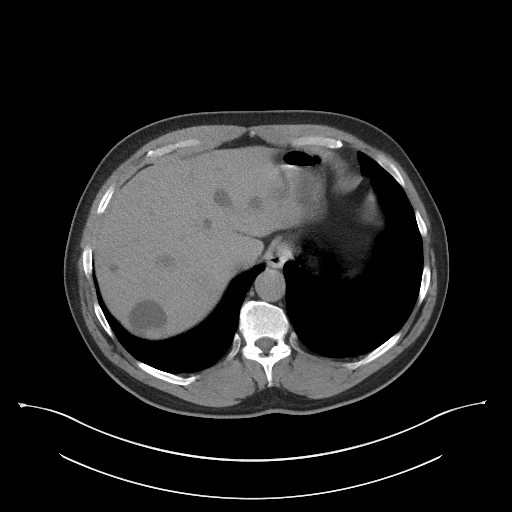
[im 100/105  soft-tissue]
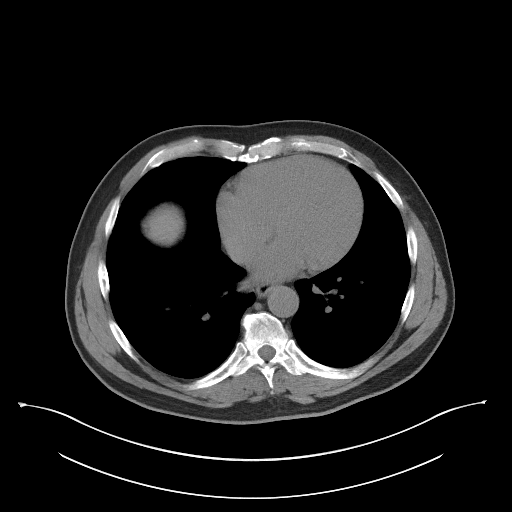

[Series 5: coronal · coronal · 0.77mm/px · 3 of 150 slices shown]
[im 50/150  soft-tissue]
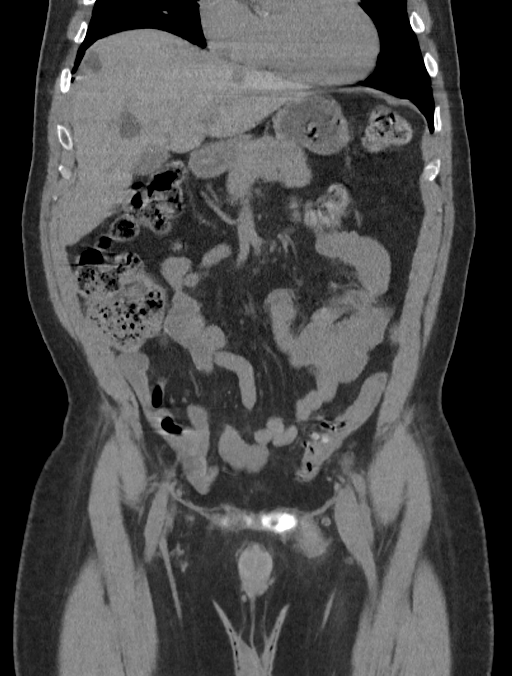
[im 67/150  soft-tissue]
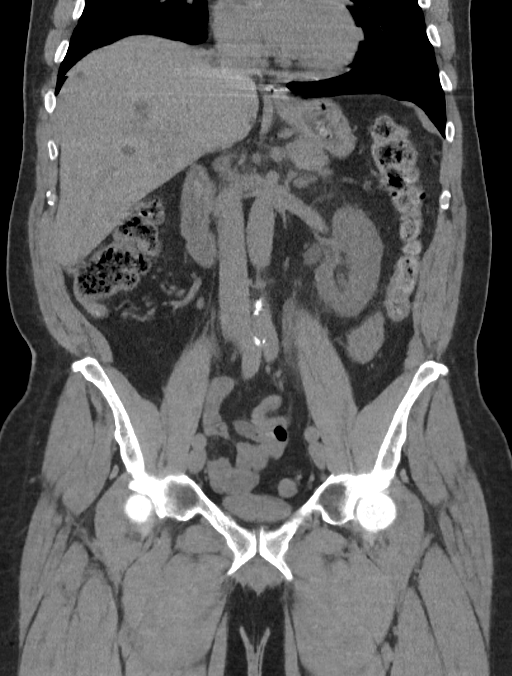
[im 83/150  soft-tissue]
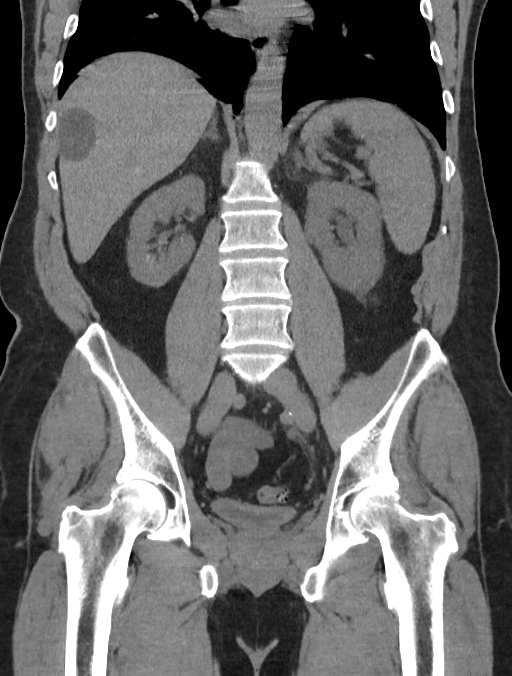

[16 of 46 positions shown; findings below may reference images not displayed]

FINDINGS: Lower chest: Lung bases demonstrate no acute consolidation or
effusion. Normal cardiac size. Calcified nodule or lymph node at the
left cardio phrenic sulcus.

Hepatobiliary: Numerous hepatic cysts. Additional subcentimeter
low-density liver lesions too small to further characterize.
Gallstones. No biliary dilatation

Pancreas: Unremarkable. No pancreatic ductal dilatation or
surrounding inflammatory changes.

Spleen: Normal in size without focal abnormality.

Adrenals/Urinary Tract: Adrenal glands are normal. Small intrarenal
stones bilaterally. Mild left hydronephrosis and hydroureter,
secondary to a 5 by 5 mm stone in the distal left ureter at the left
UVJ.

Stomach/Bowel: Stomach is within normal limits. Appendix appears
normal. No evidence of bowel wall thickening, distention, or
inflammatory changes. Diverticular disease of the colon without
acute inflammatory process.

Vascular/Lymphatic: Mild aortic atherosclerosis. No aneurysm. No
suspicious nodes.

Reproductive: Prostate is unremarkable.

Other: Negative for pelvic effusion or free air

Musculoskeletal: No acute or significant osseous findings.
IMPRESSION: 1. Mild left hydronephrosis and hydroureter, secondary to a 5 mm
stone at the left UVJ.
2. Small intrarenal stones
3. Diverticular disease of the left colon without acute wall
thickening
4. Gallstones

## 2023-12-22 ENCOUNTER — Ambulatory Visit: Payer: Medicare PPO | Admitting: *Deleted

## 2023-12-22 VITALS — Ht 69.0 in | Wt 205.0 lb

## 2023-12-22 DIAGNOSIS — Z0000 Encounter for general adult medical examination without abnormal findings: Secondary | ICD-10-CM

## 2023-12-22 NOTE — Progress Notes (Signed)
 Subjective:   Charles Waters is a 73 y.o. who presents for a Medicare Wellness preventive visit.  Visit Complete: Virtual I connected with  Charles Waters on 12/22/23 by a audio enabled telemedicine application and verified that I am speaking with the correct person using two identifiers.  Patient Location: Home  Provider Location: Home Office  I discussed the limitations of evaluation and management by telemedicine. The patient expressed understanding and agreed to proceed.  Vital Signs: Because this visit was a virtual/telehealth visit, some criteria may be missing or patient reported. Any vitals not documented were not able to be obtained and vitals that have been documented are patient reported.  VideoDeclined- This patient declined Librarian, academic. Therefore the visit was completed with audio only.  Persons Participating in Visit: Patient.  AWV Questionnaire: No: Patient Medicare AWV questionnaire was not completed prior to this visit.  Cardiac Risk Factors include: advanced age (>55men, >37 women);male gender;obesity (BMI >30kg/m2);dyslipidemia;hypertension     Objective:    Today's Vitals   12/22/23 1014  Weight: 205 lb (93 kg)  Height: 5\' 9"  (1.753 m)   Body mass index is 30.27 kg/m.     12/22/2023   10:30 AM 08/12/2022   11:18 AM 05/22/2022    3:04 PM 10/28/2021    8:12 AM 09/23/2021    6:51 AM 09/06/2021    9:01 PM  Advanced Directives  Does Patient Have a Medical Advance Directive? No No No No No No  Would patient like information on creating a medical advance directive? No - Patient declined No - Patient declined  No - Patient declined No - Patient declined     Current Medications (verified) Outpatient Encounter Medications as of 12/22/2023  Medication Sig   amLODipine (NORVASC) 10 MG tablet TAKE 1 TABLET BY MOUTH EVERY DAY   aspirin 81 MG EC tablet Take by mouth.   atorvastatin (LIPITOR) 40 MG tablet Take 1 tablet (40 mg total)  by mouth daily.   citalopram (CELEXA) 10 MG tablet Take 1 tablet (10 mg total) by mouth daily.   diclofenac Sodium (VOLTAREN) 1 % GEL Apply 2 g topically 4 (four) times daily as needed.   enalapril (VASOTEC) 20 MG tablet TAKE 1 TABLET BY MOUTH EVERY DAY   febuxostat (ULORIC) 40 MG tablet TAKE 1 TABLET (40 MG TOTAL) BY MOUTH ONCE DAILY FOR GOUT FLARE PREVENTION   gabapentin (NEURONTIN) 300 MG capsule Take 300 mg by mouth daily as needed.   nicotine polacrilex (NICORETTE) 2 MG gum Place inside cheek.   tiZANidine (ZANAFLEX) 4 MG tablet Take 1 tablet (4 mg total) by mouth every 8 (eight) hours as needed for muscle spasms.   No facility-administered encounter medications on file as of 12/22/2023.    Allergies (verified) Banana and Latex   History: Past Medical History:  Diagnosis Date   Abdominal hernia 04/15/2013   Abnormal weight gain 04/15/2013   Abnormal weight gain 04/15/2013   Actinic keratoses 11/12/2019   Formatting of this note might be different from the original. Seen at [facility] dermatology by PA Wendee Beavers for full skin exam on 11/20/2018.  Distributed on the face.  These were precancerous proliferations that can occur with sun damaged skin.  Also subset of AK's can develop into squamous cell carcinoma.  She discussed treatment with cryotherapy versus 5-fluorouracil.  Recommended field tr   Actinic keratoses 11/12/2019   Formatting of this note might be different from the original.  Seen at [facility] dermatology by PA [facility]  [place] for full skin exam on 11/20/2018.  Distributed on the face.  These were precancerous proliferations that can occur with sun damaged skin.  Also subset of AK's can develop into squamous cell carcinoma.  She discussed treatment with cryotherapy versus 5-fluorouracil.  Recommended field t   Anxiety state 04/15/2013   Anxiety state 04/15/2013   Benign essential hypertension 04/15/2013   Last Assessment & Plan:  Formatting of this note might be  different from the original. ASSESSMENT: Blood pressure is well controlled here today.  Home BP readings range: no concerning readings   PLAN:  Medications: continue Amlodipine, Enalapril. -  Labs as per orders if indicated/due Check & log BP and bring readings to f/u and machine.  Low sodium/DASH diet is advised. Consistent aerobic exercis   Cataract 03/28/2012   Contact dermatitis and other eczema, due to unspecified cause 04/15/2013   Contact dermatitis and other eczema, due to unspecified cause 04/15/2013   Depression 04/15/2013   Dysmetabolic syndrome X 04/15/2013   Dysmetabolic syndrome X 04/15/2013   Elevated LFTs 07/16/2022   Family history of congestive heart failure 07/30/2017   Formatting of this note might be different from the original. 03/28/2016:  Mother & identical twin brother both with CHF. Identical twin much self inflicted-alcoholism, smoking, drugs, etc.  We discuss utility of BNP testing and/or cardiac consult. I advise the latter for full evaluation as Anthone has RFs certainly with HTN & HL, white male of 64yoa. He is obese by BMI despite his routine CV exercise    Gout    Gout, unspecified 04/15/2013   Last Assessment & Plan:  Formatting of this note might be different from the original. ASSESSMENT: stable without acute flares   PLAN: Medications: continue Uloric 40mg  generic  Dietary precautions  Continue routine exercise.  Weight loss encouraged.   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 AH Type I   Hypercholesterolemia 04/15/2013   Last Assessment & Plan:  Formatting of this note is different from the original. ASSESSMENT: Level of control to be determined by today's lab results  Lab Results  Component Value Date   CHOLTOTAL 109 06/08/2020   CHOLTOTAL 127 11/04/2019   CHOLTOTAL 97 07/11/2019   Lab Results  Component Value Date   HDL 48 06/08/2020   HDL 60 11/04/2019   HDL 42 07/11/2019   Lab Results   Component Value Date   L   Impaired fasting glucose 04/15/2013   Lipoma 04/15/2013   Melanocytic nevus 07/30/2017   Muscle weakness (generalized) 04/15/2013   Myopia of both eyes 07/30/2017   Neoplasm of uncertain behavior of skin 04/15/2013   Neoplasm of uncertain behavior of skin 04/15/2013   Nephrolithiasis 10/03/2018   Nephrolithiasis 10/03/2018   Nicotine dependence 07/30/2017   Obesity 04/15/2013   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   Last Assessment & Plan:  Formatting of this note is different from the original. ASSESSMENT: improved & he's made adjustments   PLAN: Patient referred to and seen by Dr. Dayna Barker of [facility] on 11/13/2019.  They discussed the history symptoms and radiograph findings.  They address 3 levels of treatment.  Level 1-splinting ora   Other malaise and fatigue 04/15/2013   Pain in joints 04/15/2013   Panic disorder without agoraphobia 04/15/2013   Panic disorder without agoraphobia 04/15/2013   Presbyopia  03/28/2016   Presbyopia 03/28/2016   PVD (posterior vitreous detachment), right eye 02/27/2012   Formatting of this note might be different from the original. Hemorrhagic, no RD or tears  Incomplete posterior vitreous separation, OS  Last Assessment & Plan:  Formatting of this note might be different from the original. Relevant Hx: Course: Daily Update: Today's Plan:   Rash and other nonspecific skin eruption 04/15/2013   Renal function test abnormal 04/15/2013   Sebaceous hyperplasia 11/12/2019   Formatting of this note might be different from the original. Seen at [facility] dermatology by PA Wendee Beavers for full skin exam on 11/20/2018.  Located on the face.  These are benign and enlarged oil glands within the skin.   Sebaceous hyperplasia 11/12/2019   Formatting of this note might be different from the original. Seen at [facility] dermatology by  PA Wendee Beavers for full skin exam on 11/20/2018.  Located on the face.  These are benign and enlarged oil glands within the skin.   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 Seen at [facility] dermatology by PA Wendee Beavers for full skin exam on 11/20/2018.  Located on the trunk.  Next counseled that this is a yeast infection.  Reported the rash is not bothersome and opted for no treatment.   Urticaria 04/15/2013   Verruca plantaris 11/12/2019   Formatting of this note might be different from the original. Seen at [facility] dermatology by PA Wendee Beavers for full skin exam on 11/20/2018.  Located on the right lateral plantar midfoot.  Generally care on the palms and soles and are caused by viral infections.  The spread through direct contact.  Gust that there are treatment options and patient declined treatment that day.   Past Surgical History:  Procedure Laterality Date   COLONOSCOPY WITH PROPOFOL N/A 10/28/2021   Procedure: COLONOSCOPY WITH PROPOFOL;  Surgeon: Wyline Mood, MD;  Location: [facility] ENDOSCOPY;  Service: Gastroenterology;  Laterality: N/A;   EXTRACORPOREAL SHOCK WAVE LITHOTRIPSY Left 09/23/2021   Procedure: EXTRACORPOREAL SHOCK WAVE LITHOTRIPSY (ESWL);  Surgeon: Riki Altes, MD;  Location: [facility] ORS;  Service: Urology;  Laterality: Left;   KNEE SURGERY     LIPOMA EXCISION Right    RETINAL TEAR REPAIR CRYOTHERAPY     SKIN LESION EXCISION Left 07/2023   shoulder   VASECTOMY     Family History  Problem Relation Age of Onset   Heart failure Mother    Hypertension Father    Stroke Father    Throat cancer Father    Diabetes Brother    Heart disease Brother    Colitis Brother     Social History   Socioeconomic History   Marital status: Married    Spouse name: Not on file   Number of children: Not on file   Years of education: Not on file   Highest education level: Master's degree (e.g., MA, MS, MEng, MEd, MSW, MBA)  Occupational History   Not on file  Tobacco Use   Smoking status: Never   Smokeless tobacco: Never  Vaping Use   Vaping status: Never Used  Substance and Sexual Activity   Alcohol use: Not Currently   Drug use: Never   Sexual activity: Not on file  Other Topics Concern   Not on file  Social History Narrative   Married   Social Drivers of Health   Financial Resource Strain: Low Risk  (12/22/2023)   Overall Financial Resource Strain (CARDIA)    Difficulty of Paying Living Expenses: Not hard at all  Food Insecurity: No Food Insecurity (12/22/2023)   Hunger Vital Sign    Worried About Running Out of Food in the Last Year: Never true    Ran Out of Food in the Last Year: Never true  Transportation Needs: No Transportation Needs (12/22/2023)   PRAPARE - Administrator, Civil Service (Medical): No    Lack of Transportation (Non-Medical): No  Physical Activity: Sufficiently Active (12/22/2023)   Exercise Vital Sign    Days of Exercise per Week: 5 days    Minutes of Exercise per Session: 50 min  Stress: No Stress Concern Present (12/22/2023)   [employer] of Occupational Health - Occupational Stress Questionnaire    Feeling of Stress : Not at all  Social Connections: Moderately Integrated (12/22/2023)   Social Connection and Isolation Panel [NHANES]    Frequency of Communication with Friends and Family: More than three times a week    Frequency of Social Gatherings with Friends and Family: Twice a week    Attends Religious Services: More than 4 times per year    Active Member of [employer] or Organizations: No    Attends Engineer, structural: Never    Marital Status: Married    Tobacco Counseling Counseling given:  Not Answered    Clinical Intake:  Pre-visit preparation completed: Yes  Pain : No/denies pain     BMI - recorded: 30.27 Nutritional Status: BMI > 30  Obese Nutritional Risks: None Diabetes: No  Lab Results  Component Value Date   HGBA1C 5.2 05/22/2023   HGBA1C 5.4 05/09/2022     How often do you need to have someone help you when you read instructions, pamphlets, or other written materials from your doctor or pharmacy?: 1 - Never  Interpreter Needed?: No  Information entered by :: R. Lashunda Greis LPN   Activities of Daily Living     12/22/2023   10:17 AM  In your present state of health, do you have any difficulty performing the following activities:  Hearing? 1  Comment some difficulty right ear  Vision? 0  Comment glasses  Difficulty concentrating or making decisions? 0  Walking or climbing stairs? 0  Dressing or bathing? 0  Doing errands, shopping? 0  Preparing Food and eating ? N  Using the Toilet? N  In the past six months, have you accidently leaked urine? N  Do you have problems with loss of bowel control? N  Managing your Medications? N  Managing your Finances? N  Housekeeping or managing your Housekeeping? N    Patient Care Team: Dana Allan, MD as PCP - General (Family Medicine)  Indicate any recent Medical Services you may have received from other than [facility] providers in the past year (date may be approximate).     Assessment:   This is a routine wellness examination for Charles Waters.  Hearing/Vision screen Hearing Screening - Comments:: Some difficulty right ear Vision Screening - Comments:: glasses   Goals Addressed             This Visit's Progress    Patient Stated       Wants to continue to post things on website       Depression Screen  12/22/2023   10:25 AM 05/22/2023    8:42 AM 05/04/2023    1:53 PM 08/12/2022   11:20 AM 07/11/2022    3:17 PM 05/09/2022    1:37 PM  PHQ 2/9 Scores  PHQ - 2 Score 0 0 0 0 0 0  PHQ- 9 Score 0 0 2        Fall Risk     12/22/2023   10:19 AM 05/22/2023    8:42 AM 05/04/2023    1:53 PM 08/12/2022   11:13 AM 07/11/2022    3:17 PM  Fall Risk   Falls in the past year? 1 0 0 0 0  Comment while hiking      Number falls in past yr: 0 0 0 0 0  Injury with Fall? 0 0 0 0 0  Risk for fall due to : History of fall(s);Impaired balance/gait No Fall Risks No Fall Risks No Fall Risks No Fall Risks  Follow up Falls evaluation completed;Falls prevention discussed Falls evaluation completed Falls evaluation completed Falls evaluation completed Falls evaluation completed    MEDICARE RISK AT HOME:  Medicare Risk at Home Any stairs in or around the home?: No If so, are there any without handrails?: No Home free of loose throw rugs in walkways, pet beds, electrical cords, etc?: Yes Adequate lighting in your home to reduce risk of falls?: Yes Life alert?: No Use of a cane, walker or w/c?: No Grab bars in the bathroom?: Yes Shower chair or bench in shower?: No Elevated toilet seat or a handicapped toilet?: No  TIMED UP AND GO:  Was the test performed?  No  Cognitive Function: 6CIT completed        12/22/2023   10:31 AM 08/12/2022   11:28 AM  6CIT Screen  What Year? 0 points 0 points  What month? 0 points 0 points  What time? 0 points 0 points  Count back from 20 0 points 0 points  Months in reverse 0 points 0 points  Repeat phrase 0 points 0 points  Total Score 0 points 0 points    Immunizations Immunization History  Administered Date(s) Administered   Fluad Quad(high Dose 65+) 08/02/2019, 06/23/2021   Influenza, High Dose Seasonal PF 07/31/2017, 06/29/2018, 06/08/2020   Influenza,inj,Quad PF,6+ Mos 06/25/2007, 06/30/2008, 07/09/2009, 07/02/2010, 07/05/2012   Influenza-Unspecified 06/25/2007, 07/09/2009, 07/02/2010, 07/23/2013   PFIZER Comirnaty(Gray Top)Covid-19 Tri-Sucrose Vaccine 11/13/2019, 12/04/2019, 08/15/2020   Pfizer Covid-19 Vaccine Bivalent Booster 35yrs & up 06/23/2021    Pneumococcal Conjugate-13 08/24/2017   Pneumococcal Polysaccharide-23 10/30/2018   Td 10/31/2005, 06/08/2020   Tdap 06/30/2008   Zoster Recombinant(Shingrix) 11/08/2018, 02/27/2019   Zoster, Live 01/07/2011    Screening Tests Health Maintenance  Topic Date Due   COVID-19 Vaccine (5 - 2024-25 season) 05/28/2023   Medicare Annual Wellness (AWV)  08/13/2023   INFLUENZA VACCINE  12/25/2023 (Originally 04/27/2023)   DTaP/Tdap/Td (4 - Td or Tdap) 06/08/2030   Colonoscopy  10/29/2031   Pneumonia Vaccine 9+ Years old  Completed   Hepatitis C Screening  Completed   Zoster Vaccines- Shingrix  Completed   HPV VACCINES  Aged Out    Health Maintenance  Health Maintenance Due  Topic Date Due   COVID-19 Vaccine (5 - 2024-25 season) 05/28/2023   Medicare Annual Wellness (AWV)  08/13/2023   Health Maintenance Items Addressed: Patient did not have a flu shot this season. Discussed the need to have a flu shot annually and update covid vaccine.  Additional Screening:  Vision Screening: Recommended annual  ophthalmology exams for early detection of glaucoma and other disorders of the eye. Up to date  [facility]  Dental Screening: Recommended annual dental exams for proper oral hygiene  Community Resource Referral / Chronic Care Management: CRR required this visit?  No   CCM required this visit?  No     Plan:     I have personally reviewed and noted the following in the patient's chart:   Medical and social history Use of alcohol, tobacco or illicit drugs  Current medications and supplements including opioid prescriptions. Patient is not currently taking opioid prescriptions. Functional ability and status Nutritional status Physical activity Advanced directives List of other physicians Hospitalizations, surgeries, and ER visits in previous 12 months Vitals Screenings to include cognitive, depression, and falls Referrals and appointments  In addition, I have reviewed and  discussed with patient certain preventive protocols, quality metrics, and best practice recommendations. A written personalized care plan for preventive services as well as general preventive health recommendations were provided to patient.     Sydell Axon, LPN   [id number]/[id number]   After Visit Summary: (MyChart) Due to this being a telephonic visit, the after visit summary with patients personalized plan was offered to patient via MyChart   Notes: Nothing significant to report at this time.

## 2023-12-22 NOTE — Patient Instructions (Signed)
 Mr. Charles Waters , Thank you for taking time to come for your Medicare Wellness Visit. I appreciate your ongoing commitment to your health goals. Please review the following plan we discussed and let me know if I can assist you in the future.   Referrals/Orders/Follow-Ups/Clinician Recommendations: Remember to have a flu shot annually. Consider updating your covid vaccine.  This is a list of the screening recommended for you and due dates:  Health Maintenance  Topic Date Due   COVID-19 Vaccine (5 - 2024-25 season) 05/28/2023   Flu Shot  12/25/2023*   Medicare Annual Wellness Visit  12/21/2024   DTaP/Tdap/Td vaccine (4 - Td or Tdap) 06/08/2030   Colon Cancer Screening  10/29/2031   Pneumonia Vaccine  Completed   Hepatitis C Screening  Completed   Zoster (Shingles) Vaccine  Completed   HPV Vaccine  Aged Out  *Topic was postponed. The date shown is not the original due date.    Advanced directives: (Declined) Advance directive discussed with you today. Even though you declined this today, please call our office should you change your mind, and we can give you the proper paperwork for you to fill out.      Patient will work on this.  Next Medicare Annual Wellness Visit scheduled for next year: Yes 12/23/24 @ 11:30

## 2024-03-07 ENCOUNTER — Other Ambulatory Visit: Payer: Self-pay | Admitting: Family Medicine

## 2024-03-07 DIAGNOSIS — N200 Calculus of kidney: Secondary | ICD-10-CM

## 2024-03-15 ENCOUNTER — Other Ambulatory Visit: Payer: Self-pay | Admitting: Family Medicine

## 2024-03-15 DIAGNOSIS — I10 Essential (primary) hypertension: Secondary | ICD-10-CM

## 2024-03-19 ENCOUNTER — Other Ambulatory Visit: Payer: Self-pay | Admitting: Family Medicine

## 2024-03-19 DIAGNOSIS — I10 Essential (primary) hypertension: Secondary | ICD-10-CM

## 2024-06-18 ENCOUNTER — Telehealth: Payer: Self-pay | Admitting: Family Medicine

## 2024-06-18 NOTE — Telephone Encounter (Signed)
 Left VM and sent 2nd MyChart message letting pt know that in order to continue car/med refills, he will need to schedule a TOC.  E2C2, please schedule a TOC visit with the pt if they call back -kh

## 2024-07-22 DIAGNOSIS — M109 Gout, unspecified: Secondary | ICD-10-CM | POA: Diagnosis not present

## 2024-07-22 DIAGNOSIS — N529 Male erectile dysfunction, unspecified: Secondary | ICD-10-CM | POA: Diagnosis not present

## 2024-07-22 DIAGNOSIS — I10 Essential (primary) hypertension: Secondary | ICD-10-CM | POA: Diagnosis not present

## 2024-07-22 DIAGNOSIS — E785 Hyperlipidemia, unspecified: Secondary | ICD-10-CM | POA: Diagnosis not present

## 2024-07-22 DIAGNOSIS — M1990 Unspecified osteoarthritis, unspecified site: Secondary | ICD-10-CM | POA: Diagnosis not present

## 2024-07-22 DIAGNOSIS — Z7982 Long term (current) use of aspirin: Secondary | ICD-10-CM | POA: Diagnosis not present

## 2024-07-22 DIAGNOSIS — G629 Polyneuropathy, unspecified: Secondary | ICD-10-CM | POA: Diagnosis not present

## 2024-07-22 DIAGNOSIS — Z8249 Family history of ischemic heart disease and other diseases of the circulatory system: Secondary | ICD-10-CM | POA: Diagnosis not present

## 2024-07-22 DIAGNOSIS — F325 Major depressive disorder, single episode, in full remission: Secondary | ICD-10-CM | POA: Diagnosis not present

## 2024-09-20 ENCOUNTER — Telehealth: Payer: Self-pay

## 2024-09-20 NOTE — Telephone Encounter (Signed)
 Copied from CRM [id number]. Topic: Appointments - Scheduling Inquiry for Clinic >> Sep 18, 2024 12:26 PM Deaijah H wrote: Reason for CRM: Patient called in stating he received a message regarding a new date from wait list tried to schedule through mychart but wouldn't let him. On my end wait list still only showed 1/29. Please call.  I left voicemail for patient letting him know that we did receive his message.  I let patient know that it's my understanding that when a slot comes open on a provider's schedule, a message is sent to everyone on the wait list for that particular provider, and whoever responds first gets the slot.  I let him know that someone may have gotten to it first in this case, but stay tuned, because someone else may cancel.

## 2024-09-25 ENCOUNTER — Other Ambulatory Visit: Payer: Self-pay

## 2024-09-25 DIAGNOSIS — N200 Calculus of kidney: Secondary | ICD-10-CM

## 2024-09-25 DIAGNOSIS — I10 Essential (primary) hypertension: Secondary | ICD-10-CM

## 2024-09-25 MED ORDER — AMLODIPINE BESYLATE 10 MG PO TABS: 10.0000 mg | ORAL_TABLET | Freq: Every day | ORAL | 0 refills | Status: DC

## 2024-09-25 MED ORDER — ENALAPRIL MALEATE 20 MG PO TABS: 20.0000 mg | ORAL_TABLET | Freq: Every day | ORAL | 0 refills | Status: DC

## 2024-09-25 MED ORDER — ATORVASTATIN CALCIUM 40 MG PO TABS: 40.0000 mg | ORAL_TABLET | Freq: Every day | ORAL | 0 refills | Status: DC

## 2024-09-25 NOTE — Telephone Encounter (Signed)
 Patient has TOC appt with Leron Glance 10/24/24 but will run out of medications before that appointment.

## 2024-10-24 ENCOUNTER — Ambulatory Visit: Admitting: Nurse Practitioner

## 2024-10-24 ENCOUNTER — Encounter: Payer: Self-pay | Admitting: Nurse Practitioner

## 2024-10-24 VITALS — BP 132/81 | HR 85 | Temp 98.3°F | Ht 69.0 in | Wt 214.0 lb

## 2024-10-24 DIAGNOSIS — Z6831 Body mass index (BMI) 31.0-31.9, adult: Secondary | ICD-10-CM

## 2024-10-24 DIAGNOSIS — E7800 Pure hypercholesterolemia, unspecified: Secondary | ICD-10-CM | POA: Diagnosis not present

## 2024-10-24 DIAGNOSIS — E66811 Obesity, class 1: Secondary | ICD-10-CM

## 2024-10-24 DIAGNOSIS — Z1329 Encounter for screening for other suspected endocrine disorder: Secondary | ICD-10-CM

## 2024-10-24 DIAGNOSIS — I10 Essential (primary) hypertension: Secondary | ICD-10-CM

## 2024-10-24 DIAGNOSIS — M792 Neuralgia and neuritis, unspecified: Secondary | ICD-10-CM | POA: Insufficient documentation

## 2024-10-24 DIAGNOSIS — Z125 Encounter for screening for malignant neoplasm of prostate: Secondary | ICD-10-CM

## 2024-10-24 DIAGNOSIS — M109 Gout, unspecified: Secondary | ICD-10-CM

## 2024-10-24 MED ORDER — FEBUXOSTAT 40 MG PO TABS: ORAL_TABLET | ORAL | 3 refills | Status: AC

## 2024-10-24 MED ORDER — ENALAPRIL MALEATE 20 MG PO TABS: 20.0000 mg | ORAL_TABLET | Freq: Every day | ORAL | 3 refills | Status: AC

## 2024-10-24 MED ORDER — AMLODIPINE BESYLATE 10 MG PO TABS: 10.0000 mg | ORAL_TABLET | Freq: Every day | ORAL | 3 refills | Status: AC

## 2024-10-24 MED ORDER — ATORVASTATIN CALCIUM 40 MG PO TABS: 40.0000 mg | ORAL_TABLET | Freq: Every day | ORAL | 3 refills | Status: AC

## 2024-10-24 MED ORDER — GABAPENTIN 300 MG PO CAPS: 300.0000 mg | ORAL_CAPSULE | Freq: Every day | ORAL | 0 refills | Status: AC | PRN

## 2024-10-24 NOTE — Assessment & Plan Note (Signed)
 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 Continue gabapentin  at bedtime as needed. Refill sent.

## 2024-10-24 NOTE — Progress Notes (Signed)
 " Leron Glance, NP-C Phone: [phone]  Discussed the use of AI scribe software for clinical note transcription with the patient, who gave verbal consent to proceed.  History of Present Illness   Tremane Spurgeon is a 74 year old male who presents for medication refills and transfer of care.  He has recently moved to [facility] on January 1st and has stopped taking Celexa  after reuniting with his wife, noting stable mood and anxiety without it.  He is currently taking Lipitor for cholesterol management and uses amlodipine  and enalapril  for blood pressure control. He monitors his blood pressure at home every two weeks, with readings typically between 115-125/75-85 mmHg. No chest pain, shortness of breath, dizziness, or swelling.  He takes Uloric  daily for gout management and has modified his diet to avoid triggering foods, such as shrimp. He emphasizes the importance of dietary management alongside medication.  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 He uses gabapentin  as needed, primarily at bedtime when the pain is severe enough to interfere with sleep. The last refill was over five years ago, and he notes that the effectiveness of the remaining medication is diminishing.  He occasionally takes naproxen, about once a month, for general aches and pains, and uses ice and heat therapy as part of his pain management routine. He mentions a history of injuries from weightlifting in the early 1970s.  He has a twin brother who has had significant health issues related to lifestyle choices, including smoking and prescription medication misuse, which has influenced his own commitment to a healthy lifestyle. He is a retired optometrist and has recently reconciled with his wife after a long separation. He has a daughter who has overcome a serious drinking problem and is now successfully running her own business.       Tobacco Use History[1]  Medications Ordered Prior to  Encounter[2]   ROS see history of present illness  Objective  Physical Exam Vitals:   10/24/24 1342  BP: 132/81  Pulse: 85  Temp: 98.3 F (36.8 C)  SpO2: 98%    BP Readings from Last 3 Encounters:  10/24/24 132/81  05/22/23 120/68  05/04/23 132/78   Wt Readings from Last 3 Encounters:  10/24/24 214 lb (97.1 kg)  12/22/23 205 lb (93 kg)  05/22/23 211 lb 4 oz (95.8 kg)    Physical Exam Constitutional:      General: He is not in acute distress.    Appearance: Normal appearance.  HENT:     Head: Normocephalic.  Cardiovascular:     Rate and Rhythm: Normal rate and regular rhythm.     Heart sounds: Normal heart sounds.  Pulmonary:     Effort: Pulmonary effort is normal.     Breath sounds: Normal breath sounds.  Skin:    General: Skin is warm and dry.  Neurological:     General: No focal deficit present.     Mental Status: He is alert.  Psychiatric:        Mood and Affect: Mood normal.        Behavior: Behavior normal.      Assessment/Plan: Please see individual problem list.  Benign essential hypertension Assessment & Plan: Blood pressure is well-controlled with amlodipine  and enalapril , with home readings between 115-125/75-85 mmHg. Continue amlodipine  and enalapril . Continue home monitoring. Check lab work as outlined.   Orders: -     Enalapril  Maleate; Take 1 tablet (20 mg total) by mouth daily.  Dispense: 90 tablet; Refill: 3 -  amLODIPine  Besylate; Take 1 tablet (10 mg total) by mouth daily.  Dispense: 90 tablet; Refill: 3 -     Comprehensive metabolic panel with GFR -     CBC with Differential/Platelet  Hypercholesterolemia Assessment & Plan: Managed with Lipitor. Continue Lipitor daily and check lipid panel.   Orders: -     Atorvastatin  Calcium ; Take 1 tablet (40 mg total) by mouth daily.  Dispense: 90 tablet; Refill: 3 -     Lipid panel -     Comprehensive metabolic panel with GFR  Gout, unspecified cause, unspecified chronicity,  unspecified site Assessment & Plan: Uloric  is effective in preventing flare-ups, and he adheres to dietary modifications. Continue Uloric  and maintain dietary modifications.  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 Continue gabapentin  at bedtime as needed. Refill sent.   Orders: -     Gabapentin ; Take 1 capsule (300 mg total) by mouth daily as needed.  Dispense: 90 capsule; Refill: 0  Class 1 obesity without serious comorbidity with body mass index (BMI) of 31.0 to 31.9 in adult, unspecified obesity type Assessment & Plan: Encourage healthy diet and regular exercise. Check A1c.   Orders: -     Hemoglobin A1c  Screening PSA (prostate specific antigen) -     PSA  Thyroid disorder screen -     TSH     Return in about 6 months (around 04/23/2025) for Follow up.   Leron Glance, NP-C [facility] [place]     [1]  Social History Tobacco Use  Smoking Status Never  Smokeless Tobacco Never  [2]  Current Outpatient Medications on File Prior to Visit  Medication Sig Dispense Refill   aspirin 81 MG EC tablet Take by mouth.     diclofenac Sodium (VOLTAREN) 1 % GEL Apply 2 g topically 4 (four) times daily as needed.     nicotine polacrilex (NICORETTE) 2 MG gum Place inside cheek.     No current facility-administered medications on file prior to visit.   "

## 2024-10-24 NOTE — Assessment & Plan Note (Signed)
 Uloric  is effective in preventing flare-ups, and he adheres to dietary modifications. Continue Uloric  and maintain dietary modifications.

## 2024-10-24 NOTE — Assessment & Plan Note (Signed)
 Blood pressure is well-controlled with amlodipine  and enalapril , with home readings between 115-125/75-85 mmHg. Continue amlodipine  and enalapril . Continue home monitoring. Check lab work as outlined.

## 2024-10-24 NOTE — Assessment & Plan Note (Signed)
 Managed with Lipitor. Continue Lipitor daily and check lipid panel.

## 2024-10-24 NOTE — Assessment & Plan Note (Signed)
 Encourage healthy diet and regular exercise. Check A1c.

## 2024-10-25 LAB — CBC WITH DIFFERENTIAL/PLATELET
Basophils Absolute: 0 10*3/uL (ref 0.0–0.1)
Basophils Relative: 0.7 % (ref 0.0–3.0)
Eosinophils Absolute: 0 10*3/uL (ref 0.0–0.7)
Eosinophils Relative: 0.6 % (ref 0.0–5.0)
HCT: 44.3 % (ref 39.0–52.0)
Hemoglobin: 15 g/dL (ref 13.0–17.0)
Lymphocytes Relative: 22.1 % (ref 12.0–46.0)
Lymphs Abs: 1.5 10*3/uL (ref 0.7–4.0)
MCHC: 33.9 g/dL (ref 30.0–36.0)
MCV: 89.2 fl (ref 78.0–100.0)
Monocytes Absolute: 0.6 10*3/uL (ref 0.1–1.0)
Monocytes Relative: 8.2 % (ref 3.0–12.0)
Neutro Abs: 4.7 10*3/uL (ref 1.4–7.7)
Neutrophils Relative %: 68.4 % (ref 43.0–77.0)
Platelets: 172 10*3/uL (ref 150.0–400.0)
RBC: 4.97 Mil/uL (ref 4.22–5.81)
RDW: 13.8 % (ref 11.5–15.5)
WBC: 6.9 10*3/uL (ref 4.0–10.5)

## 2024-10-25 LAB — COMPREHENSIVE METABOLIC PANEL WITH GFR
ALT: 20 U/L (ref 3–53)
AST: 19 U/L (ref 5–37)
Albumin: 4.4 g/dL (ref 3.5–5.2)
Alkaline Phosphatase: 65 U/L (ref 39–117)
BUN: 20 mg/dL (ref 6–23)
CO2: 31 meq/L (ref 19–32)
Calcium: 10.9 mg/dL — ABNORMAL HIGH (ref 8.4–10.5)
Chloride: 103 meq/L (ref 96–112)
Creatinine, Ser: 1.57 mg/dL — ABNORMAL HIGH (ref 0.40–1.50)
GFR: 43.74 mL/min — ABNORMAL LOW
Glucose, Bld: 95 mg/dL (ref 70–99)
Potassium: 4.4 meq/L (ref 3.5–5.1)
Sodium: 140 meq/L (ref 135–145)
Total Bilirubin: 0.6 mg/dL (ref 0.2–1.2)
Total Protein: 6.9 g/dL (ref 6.0–8.3)

## 2024-10-25 LAB — TSH: TSH: 1.93 u[IU]/mL (ref 0.35–5.50)

## 2024-10-25 LAB — LIPID PANEL
Cholesterol: 120 mg/dL (ref 28–200)
HDL: 47.2 mg/dL
LDL Cholesterol: 40 mg/dL (ref 10–99)
NonHDL: 72.33
Total CHOL/HDL Ratio: 3
Triglycerides: 162 mg/dL — ABNORMAL HIGH (ref 10.0–149.0)
VLDL: 32.4 mg/dL (ref 0.0–40.0)

## 2024-10-25 LAB — HEMOGLOBIN A1C: Hgb A1c MFr Bld: 5.4 % (ref 4.6–6.5)

## 2024-10-25 LAB — URIC ACID: Uric Acid, Serum: 3.7 mg/dL — ABNORMAL LOW (ref 4.0–7.8)

## 2024-10-25 LAB — PSA: PSA: 0.51 ng/mL (ref 0.10–4.00)

## 2024-10-30 ENCOUNTER — Ambulatory Visit: Payer: Self-pay | Admitting: Nurse Practitioner

## 2024-10-30 DIAGNOSIS — R899 Unspecified abnormal finding in specimens from other organs, systems and tissues: Secondary | ICD-10-CM

## 2024-10-30 DIAGNOSIS — R82994 Hypercalciuria: Secondary | ICD-10-CM

## 2024-11-01 ENCOUNTER — Other Ambulatory Visit: Payer: Self-pay | Admitting: Nurse Practitioner

## 2024-11-01 DIAGNOSIS — R82994 Hypercalciuria: Secondary | ICD-10-CM

## 2024-11-01 DIAGNOSIS — N1831 Chronic kidney disease, stage 3a: Secondary | ICD-10-CM

## 2024-11-06 ENCOUNTER — Other Ambulatory Visit

## 2024-12-23 ENCOUNTER — Ambulatory Visit

## 2025-04-23 ENCOUNTER — Ambulatory Visit: Admitting: Nurse Practitioner
